# Patient Record
Sex: Male | Born: 1950 | Hispanic: No | Marital: Married
Health system: Southern US, Community
[De-identification: ages and names within clinical notes are randomized; demographics above are authoritative.]

---

## 2003-04-16 ENCOUNTER — Other Ambulatory Visit: Payer: Self-pay

## 2003-08-16 ENCOUNTER — Other Ambulatory Visit: Payer: Self-pay

## 2004-01-08 ENCOUNTER — Other Ambulatory Visit: Payer: Self-pay

## 2004-08-21 ENCOUNTER — Emergency Department: Payer: Self-pay | Admitting: Unknown Physician Specialty

## 2004-08-21 ENCOUNTER — Other Ambulatory Visit: Payer: Self-pay

## 2004-09-01 ENCOUNTER — Ambulatory Visit: Payer: Self-pay | Admitting: Internal Medicine

## 2004-10-23 ENCOUNTER — Emergency Department: Payer: Self-pay | Admitting: Emergency Medicine

## 2005-01-19 ENCOUNTER — Other Ambulatory Visit: Payer: Self-pay

## 2005-01-20 ENCOUNTER — Other Ambulatory Visit: Payer: Self-pay

## 2005-01-20 ENCOUNTER — Inpatient Hospital Stay: Payer: Self-pay | Admitting: Internal Medicine

## 2005-01-21 ENCOUNTER — Other Ambulatory Visit: Payer: Self-pay

## 2005-06-03 ENCOUNTER — Inpatient Hospital Stay: Payer: Self-pay | Admitting: Internal Medicine

## 2005-06-03 ENCOUNTER — Other Ambulatory Visit: Payer: Self-pay

## 2005-06-04 ENCOUNTER — Other Ambulatory Visit: Payer: Self-pay

## 2005-08-22 ENCOUNTER — Other Ambulatory Visit: Payer: Self-pay

## 2005-08-22 ENCOUNTER — Inpatient Hospital Stay: Payer: Self-pay | Admitting: Internal Medicine

## 2005-08-23 ENCOUNTER — Other Ambulatory Visit: Payer: Self-pay

## 2005-09-18 ENCOUNTER — Inpatient Hospital Stay: Payer: Self-pay | Admitting: Internal Medicine

## 2005-09-18 ENCOUNTER — Other Ambulatory Visit: Payer: Self-pay

## 2005-09-28 ENCOUNTER — Encounter: Payer: Self-pay | Admitting: Internal Medicine

## 2005-10-09 ENCOUNTER — Encounter: Payer: Self-pay | Admitting: Internal Medicine

## 2005-11-09 ENCOUNTER — Encounter: Payer: Self-pay | Admitting: Internal Medicine

## 2005-12-07 ENCOUNTER — Encounter: Payer: Self-pay | Admitting: Adult Health Nurse Practitioner

## 2005-12-10 ENCOUNTER — Encounter: Payer: Self-pay | Admitting: Internal Medicine

## 2005-12-10 ENCOUNTER — Encounter: Payer: Self-pay | Admitting: Adult Health Nurse Practitioner

## 2005-12-22 ENCOUNTER — Ambulatory Visit: Payer: Self-pay | Admitting: Infectious Diseases

## 2006-01-02 ENCOUNTER — Ambulatory Visit: Payer: Self-pay | Admitting: Vascular Surgery

## 2006-01-06 ENCOUNTER — Ambulatory Visit: Payer: Self-pay | Admitting: Vascular Surgery

## 2006-01-09 ENCOUNTER — Encounter: Payer: Self-pay | Admitting: Internal Medicine

## 2006-01-15 ENCOUNTER — Other Ambulatory Visit: Payer: Self-pay

## 2006-01-15 ENCOUNTER — Inpatient Hospital Stay: Payer: Self-pay | Admitting: Internal Medicine

## 2006-01-16 ENCOUNTER — Other Ambulatory Visit: Payer: Self-pay

## 2006-01-17 ENCOUNTER — Other Ambulatory Visit: Payer: Self-pay

## 2006-02-09 ENCOUNTER — Encounter: Payer: Self-pay | Admitting: Internal Medicine

## 2006-03-11 ENCOUNTER — Encounter: Payer: Self-pay | Admitting: Internal Medicine

## 2006-05-18 ENCOUNTER — Ambulatory Visit: Payer: Self-pay | Admitting: Orthopaedic Surgery

## 2006-05-23 ENCOUNTER — Ambulatory Visit: Payer: Self-pay | Admitting: Orthopaedic Surgery

## 2006-06-26 ENCOUNTER — Emergency Department: Payer: Self-pay | Admitting: Unknown Physician Specialty

## 2006-10-14 ENCOUNTER — Inpatient Hospital Stay: Payer: Self-pay | Admitting: Internal Medicine

## 2006-10-14 ENCOUNTER — Other Ambulatory Visit: Payer: Self-pay

## 2006-10-15 ENCOUNTER — Other Ambulatory Visit: Payer: Self-pay

## 2007-03-17 ENCOUNTER — Other Ambulatory Visit: Payer: Self-pay

## 2007-03-17 ENCOUNTER — Observation Stay: Payer: Self-pay | Admitting: Internal Medicine

## 2007-03-18 ENCOUNTER — Other Ambulatory Visit: Payer: Self-pay

## 2007-09-12 ENCOUNTER — Emergency Department: Payer: Self-pay | Admitting: Emergency Medicine

## 2007-09-27 ENCOUNTER — Ambulatory Visit: Payer: Self-pay | Admitting: Pain Medicine

## 2007-10-08 ENCOUNTER — Ambulatory Visit: Payer: Self-pay | Admitting: Pain Medicine

## 2007-10-09 ENCOUNTER — Ambulatory Visit: Payer: Self-pay | Admitting: Pain Medicine

## 2007-10-22 ENCOUNTER — Emergency Department: Payer: Self-pay | Admitting: Emergency Medicine

## 2007-11-01 ENCOUNTER — Ambulatory Visit: Payer: Self-pay | Admitting: Physician Assistant

## 2007-11-06 ENCOUNTER — Ambulatory Visit: Payer: Self-pay | Admitting: Pain Medicine

## 2007-11-27 ENCOUNTER — Ambulatory Visit: Payer: Self-pay | Admitting: Pain Medicine

## 2007-12-12 ENCOUNTER — Ambulatory Visit: Payer: Self-pay | Admitting: Physician Assistant

## 2008-03-17 ENCOUNTER — Emergency Department: Payer: Self-pay | Admitting: Emergency Medicine

## 2008-03-31 ENCOUNTER — Ambulatory Visit: Payer: Self-pay | Admitting: Otolaryngology

## 2008-03-31 ENCOUNTER — Ambulatory Visit: Payer: Self-pay | Admitting: Internal Medicine

## 2008-04-10 ENCOUNTER — Ambulatory Visit: Payer: Self-pay | Admitting: Otolaryngology

## 2008-09-19 ENCOUNTER — Ambulatory Visit: Payer: Self-pay | Admitting: Internal Medicine

## 2008-09-20 ENCOUNTER — Emergency Department: Payer: Self-pay | Admitting: Emergency Medicine

## 2009-08-23 ENCOUNTER — Inpatient Hospital Stay: Payer: Self-pay | Admitting: Internal Medicine

## 2009-10-30 ENCOUNTER — Observation Stay: Payer: Self-pay | Admitting: Internal Medicine

## 2009-11-06 ENCOUNTER — Ambulatory Visit: Payer: Self-pay | Admitting: Internal Medicine

## 2010-01-13 ENCOUNTER — Encounter: Payer: Self-pay | Admitting: Otolaryngology

## 2010-02-09 ENCOUNTER — Encounter: Payer: Self-pay | Admitting: Otolaryngology

## 2010-03-11 ENCOUNTER — Ambulatory Visit: Payer: Self-pay | Admitting: Internal Medicine

## 2010-09-24 ENCOUNTER — Emergency Department: Payer: Self-pay | Admitting: Emergency Medicine

## 2011-02-01 ENCOUNTER — Emergency Department: Payer: Self-pay | Admitting: Emergency Medicine

## 2011-02-02 ENCOUNTER — Emergency Department: Payer: Self-pay | Admitting: Internal Medicine

## 2011-06-20 ENCOUNTER — Emergency Department: Payer: Self-pay | Admitting: Emergency Medicine

## 2011-06-20 LAB — COMPREHENSIVE METABOLIC PANEL
Alkaline Phosphatase: 32 U/L — ABNORMAL LOW (ref 50–136)
Anion Gap: 13 (ref 7–16)
Bilirubin,Total: 0.5 mg/dL (ref 0.2–1.0)
Calcium, Total: 8.5 mg/dL (ref 8.5–10.1)
Chloride: 106 mmol/L (ref 98–107)
Co2: 25 mmol/L (ref 21–32)
Creatinine: 2.34 mg/dL — ABNORMAL HIGH (ref 0.60–1.30)
EGFR (African American): 37 — ABNORMAL LOW
EGFR (Non-African Amer.): 30 — ABNORMAL LOW
Osmolality: 293 (ref 275–301)
Potassium: 4 mmol/L (ref 3.5–5.1)
Sodium: 144 mmol/L (ref 136–145)

## 2011-06-20 LAB — CK TOTAL AND CKMB (NOT AT ARMC): CK, Total: 194 U/L (ref 35–232)

## 2011-06-20 LAB — CBC
HCT: 41.5 % (ref 40.0–52.0)
Platelet: 214 10*3/uL (ref 150–440)
RBC: 4.7 10*6/uL (ref 4.40–5.90)
RDW: 15.3 % — ABNORMAL HIGH (ref 11.5–14.5)
WBC: 5.9 10*3/uL (ref 3.8–10.6)

## 2011-06-20 LAB — TROPONIN I: Troponin-I: <0.02 ng/mL

## 2011-06-20 LAB — PROTIME-INR: Prothrombin Time: 12.3 secs (ref 11.5–14.7)

## 2011-06-20 LAB — APTT: Activated PTT: 33.7 secs (ref 23.6–35.9)

## 2012-01-29 ENCOUNTER — Inpatient Hospital Stay: Payer: Self-pay | Admitting: Internal Medicine

## 2012-01-29 LAB — COMPREHENSIVE METABOLIC PANEL
Albumin: 3.6 g/dL (ref 3.4–5.0)
Alkaline Phosphatase: 61 U/L (ref 50–136)
Calcium, Total: 8.3 mg/dL — ABNORMAL LOW (ref 8.5–10.1)
EGFR (African American): 41 — ABNORMAL LOW
Glucose: 187 mg/dL — ABNORMAL HIGH (ref 65–99)
Osmolality: 294 (ref 275–301)
Potassium: 4.3 mmol/L (ref 3.5–5.1)
SGOT(AST): 26 U/L (ref 15–37)
Sodium: 142 mmol/L (ref 136–145)

## 2012-01-29 LAB — CK TOTAL AND CKMB (NOT AT ARMC)
CK, Total: 213 U/L (ref 35–232)
CK-MB: 2.6 ng/mL (ref 0.5–3.6)

## 2012-01-29 LAB — CBC
HCT: 38.4 % — ABNORMAL LOW (ref 40.0–52.0)
MCV: 90 fL (ref 80–100)
Platelet: 234 10*3/uL (ref 150–440)
RBC: 4.29 10*6/uL — ABNORMAL LOW (ref 4.40–5.90)
WBC: 7.1 10*3/uL (ref 3.8–10.6)

## 2012-01-29 LAB — LIPASE, BLOOD: Lipase: 167 U/L (ref 73–393)

## 2012-01-29 LAB — PROTIME-INR
INR: 0.9
Prothrombin Time: 12.4 secs (ref 11.5–14.7)

## 2012-01-29 LAB — TROPONIN I: Troponin-I: 0.02 ng/mL

## 2012-01-30 LAB — TROPONIN I: Troponin-I: <0.02 ng/mL

## 2012-01-30 LAB — BASIC METABOLIC PANEL
Anion Gap: 9 (ref 7–16)
BUN: 27 mg/dL — ABNORMAL HIGH (ref 7–18)
Co2: 27 mmol/L (ref 21–32)
Creatinine: 2.01 mg/dL — ABNORMAL HIGH (ref 0.60–1.30)
EGFR (African American): 40 — ABNORMAL LOW
EGFR (Non-African Amer.): 34 — ABNORMAL LOW
Glucose: 154 mg/dL — ABNORMAL HIGH (ref 65–99)
Osmolality: 289 (ref 275–301)
Potassium: 4.5 mmol/L (ref 3.5–5.1)
Sodium: 141 mmol/L (ref 136–145)

## 2012-01-30 LAB — LIPID PANEL
Cholesterol: 204 mg/dL — ABNORMAL HIGH (ref 0–200)
HDL Cholesterol: 30 mg/dL — ABNORMAL LOW (ref 40–60)
VLDL Cholesterol, Calc: 71 mg/dL — ABNORMAL HIGH (ref 5–40)

## 2012-01-30 LAB — CK TOTAL AND CKMB (NOT AT ARMC)
CK, Total: 193 U/L (ref 35–232)
CK-MB: 2.8 ng/mL (ref 0.5–3.6)
CK-MB: 2.8 ng/mL (ref 0.5–3.6)

## 2012-01-30 LAB — HEMOGLOBIN A1C: Hemoglobin A1C: 7.7 % — ABNORMAL HIGH (ref 4.2–6.3)

## 2012-01-30 LAB — MAGNESIUM: Magnesium: 1.3 mg/dL — ABNORMAL LOW

## 2012-01-30 LAB — APTT: Activated PTT: 115.2 secs — ABNORMAL HIGH (ref 23.6–35.9)

## 2012-01-31 LAB — APTT
Activated PTT: 151.3 secs — ABNORMAL HIGH (ref 23.6–35.9)
Activated PTT: 89.8 secs — ABNORMAL HIGH (ref 23.6–35.9)

## 2012-01-31 LAB — MAGNESIUM: Magnesium: 2.1 mg/dL

## 2013-01-20 ENCOUNTER — Observation Stay: Payer: Self-pay | Admitting: Specialist

## 2013-01-20 LAB — MAGNESIUM: Magnesium: 0.6 mg/dL — ABNORMAL LOW

## 2013-01-20 LAB — BASIC METABOLIC PANEL
Anion Gap: 5 — ABNORMAL LOW (ref 7–16)
BUN: 35 mg/dL — ABNORMAL HIGH (ref 7–18)
Calcium, Total: 8.2 mg/dL — ABNORMAL LOW (ref 8.5–10.1)
Chloride: 104 mmol/L (ref 98–107)
Creatinine: 2.66 mg/dL — ABNORMAL HIGH (ref 0.60–1.30)
EGFR (African American): 28 — ABNORMAL LOW
EGFR (Non-African Amer.): 24 — ABNORMAL LOW
Glucose: 136 mg/dL — ABNORMAL HIGH (ref 65–99)
Osmolality: 288 (ref 275–301)
Sodium: 139 mmol/L (ref 136–145)

## 2013-01-20 LAB — CBC
HCT: 38.7 % — ABNORMAL LOW (ref 40.0–52.0)
HGB: 12.9 g/dL — ABNORMAL LOW (ref 13.0–18.0)
MCHC: 33.2 g/dL (ref 32.0–36.0)
MCV: 88 fL (ref 80–100)
RBC: 4.39 10*6/uL — ABNORMAL LOW (ref 4.40–5.90)
RDW: 14.7 % — ABNORMAL HIGH (ref 11.5–14.5)
WBC: 5.8 10*3/uL (ref 3.8–10.6)

## 2013-01-20 LAB — PROTIME-INR
INR: 1.7
Prothrombin Time: 19.7 secs — ABNORMAL HIGH (ref 11.5–14.7)

## 2013-01-20 LAB — CK-MB
CK-MB: 3.1 ng/mL (ref 0.5–3.6)
CK-MB: 3.3 ng/mL (ref 0.5–3.6)

## 2013-01-20 LAB — APTT: Activated PTT: 40.7 secs — ABNORMAL HIGH (ref 23.6–35.9)

## 2013-01-20 LAB — TROPONIN I: Troponin-I: <0.02 ng/mL

## 2013-01-21 LAB — CK-MB: CK-MB: 3.2 ng/mL (ref 0.5–3.6)

## 2013-01-22 LAB — BASIC METABOLIC PANEL
Anion Gap: 9 (ref 7–16)
BUN: 39 mg/dL — ABNORMAL HIGH (ref 7–18)
Calcium, Total: 8.3 mg/dL — ABNORMAL LOW (ref 8.5–10.1)
Chloride: 103 mmol/L (ref 98–107)
Creatinine: 2.3 mg/dL — ABNORMAL HIGH (ref 0.60–1.30)
Sodium: 137 mmol/L (ref 136–145)

## 2013-07-10 ENCOUNTER — Emergency Department: Payer: Self-pay | Admitting: Emergency Medicine

## 2013-07-10 LAB — CBC
HCT: 37.1 % — AB (ref 40.0–52.0)
HGB: 12.1 g/dL — ABNORMAL LOW (ref 13.0–18.0)
MCH: 28.7 pg (ref 26.0–34.0)
MCHC: 32.5 g/dL (ref 32.0–36.0)
MCV: 88 fL (ref 80–100)
PLATELETS: 198 10*3/uL (ref 150–440)
RBC: 4.21 10*6/uL — AB (ref 4.40–5.90)
RDW: 15.4 % — AB (ref 11.5–14.5)
WBC: 5.6 10*3/uL (ref 3.8–10.6)

## 2013-07-10 LAB — BASIC METABOLIC PANEL
ANION GAP: 8 (ref 7–16)
BUN: 25 mg/dL — ABNORMAL HIGH (ref 7–18)
CO2: 25 mmol/L (ref 21–32)
Calcium, Total: 8.4 mg/dL — ABNORMAL LOW (ref 8.5–10.1)
Chloride: 106 mmol/L (ref 98–107)
Creatinine: 2.11 mg/dL — ABNORMAL HIGH (ref 0.60–1.30)
EGFR (African American): 37 — ABNORMAL LOW
GFR CALC NON AF AMER: 32 — AB
Glucose: 156 mg/dL — ABNORMAL HIGH (ref 65–99)
Osmolality: 285 (ref 275–301)
Potassium: 3.7 mmol/L (ref 3.5–5.1)
Sodium: 139 mmol/L (ref 136–145)

## 2013-07-10 LAB — TROPONIN I
TROPONIN-I: 0.03 ng/mL
Troponin-I: 0.03 ng/mL

## 2013-07-10 LAB — PRO B NATRIURETIC PEPTIDE: B-Type Natriuretic Peptide: 680 pg/mL — ABNORMAL HIGH (ref 0–125)

## 2013-08-21 ENCOUNTER — Ambulatory Visit: Payer: Self-pay | Admitting: Vascular Surgery

## 2013-08-22 LAB — PATHOLOGY REPORT

## 2013-12-04 ENCOUNTER — Inpatient Hospital Stay: Payer: Self-pay | Admitting: Internal Medicine

## 2013-12-04 LAB — TROPONIN I
TROPONIN-I: 0.07 ng/mL — AB
Troponin-I: 0.13 ng/mL — ABNORMAL HIGH

## 2013-12-04 LAB — CK TOTAL AND CKMB (NOT AT ARMC)
CK, TOTAL: 199 U/L
CK, TOTAL: 235 U/L
CK, Total: 269 U/L
CK-MB: 2.9 ng/mL (ref 0.5–3.6)
CK-MB: 2.6 ng/mL (ref 0.5–3.6)
CK-MB: 3.2 ng/mL (ref 0.5–3.6)

## 2013-12-04 LAB — COMPREHENSIVE METABOLIC PANEL
Albumin: 3.3 g/dL — ABNORMAL LOW (ref 3.4–5.0)
Alkaline Phosphatase: 65 U/L
Anion Gap: 10 (ref 7–16)
BUN: 49 mg/dL — ABNORMAL HIGH (ref 7–18)
Bilirubin,Total: 0.5 mg/dL (ref 0.2–1.0)
CHLORIDE: 107 mmol/L (ref 98–107)
CREATININE: 2.55 mg/dL — AB (ref 0.60–1.30)
Calcium, Total: 8.8 mg/dL (ref 8.5–10.1)
Co2: 24 mmol/L (ref 21–32)
EGFR (African American): 29 — ABNORMAL LOW
GFR CALC NON AF AMER: 25 — AB
GLUCOSE: 198 mg/dL — AB (ref 65–99)
OSMOLALITY: 300 (ref 275–301)
Potassium: 4.1 mmol/L (ref 3.5–5.1)
SGOT(AST): 24 U/L (ref 15–37)
SGPT (ALT): 18 U/L
SODIUM: 141 mmol/L (ref 136–145)
Total Protein: 6.9 g/dL (ref 6.4–8.2)

## 2013-12-04 LAB — CBC
HCT: 36.9 % — ABNORMAL LOW (ref 40.0–52.0)
HGB: 11.9 g/dL — ABNORMAL LOW (ref 13.0–18.0)
MCH: 29.5 pg (ref 26.0–34.0)
MCHC: 32.4 g/dL (ref 32.0–36.0)
MCV: 91 fL (ref 80–100)
Platelet: 237 10*3/uL (ref 150–440)
RBC: 4.05 10*6/uL — AB (ref 4.40–5.90)
RDW: 15.3 % — ABNORMAL HIGH (ref 11.5–14.5)
WBC: 4.5 10*3/uL (ref 3.8–10.6)

## 2013-12-04 LAB — PROTIME-INR
INR: 1.7
Prothrombin Time: 19.8 secs — ABNORMAL HIGH (ref 11.5–14.7)

## 2013-12-04 LAB — APTT: Activated PTT: 134 secs — ABNORMAL HIGH (ref 23.6–35.9)

## 2013-12-05 LAB — CBC WITH DIFFERENTIAL/PLATELET
BASOS ABS: 0 10*3/uL (ref 0.0–0.1)
BASOS PCT: 0.3 %
EOS ABS: 0.3 10*3/uL (ref 0.0–0.7)
Eosinophil %: 5.4 %
HCT: 35.4 % — AB (ref 40.0–52.0)
HGB: 11.4 g/dL — ABNORMAL LOW (ref 13.0–18.0)
LYMPHS PCT: 38.7 %
Lymphocyte #: 1.9 10*3/uL (ref 1.0–3.6)
MCH: 29.2 pg (ref 26.0–34.0)
MCHC: 32.1 g/dL (ref 32.0–36.0)
MCV: 91 fL (ref 80–100)
MONO ABS: 0.7 x10 3/mm (ref 0.2–1.0)
Monocyte %: 14.4 %
NEUTROS ABS: 2 10*3/uL (ref 1.4–6.5)
Neutrophil %: 41.2 %
Platelet: 218 10*3/uL (ref 150–440)
RBC: 3.9 10*6/uL — AB (ref 4.40–5.90)
RDW: 14.9 % — ABNORMAL HIGH (ref 11.5–14.5)
WBC: 4.9 10*3/uL (ref 3.8–10.6)

## 2013-12-05 LAB — LIPID PANEL
CHOLESTEROL: 174 mg/dL (ref 0–200)
HDL Cholesterol: 28 mg/dL — ABNORMAL LOW (ref 40–60)
LDL CHOLESTEROL, CALC: 103 mg/dL — AB (ref 0–100)
Triglycerides: 214 mg/dL — ABNORMAL HIGH (ref 0–200)
VLDL Cholesterol, Calc: 43 mg/dL — ABNORMAL HIGH (ref 5–40)

## 2013-12-05 LAB — BASIC METABOLIC PANEL
Anion Gap: 11 (ref 7–16)
BUN: 52 mg/dL — ABNORMAL HIGH (ref 7–18)
CO2: 25 mmol/L (ref 21–32)
Calcium, Total: 9.1 mg/dL (ref 8.5–10.1)
Chloride: 106 mmol/L (ref 98–107)
Creatinine: 2.76 mg/dL — ABNORMAL HIGH (ref 0.60–1.30)
EGFR (Non-African Amer.): 23 — ABNORMAL LOW
GFR CALC AF AMER: 27 — AB
GLUCOSE: 171 mg/dL — AB (ref 65–99)
OSMOLALITY: 301 (ref 275–301)
Potassium: 4 mmol/L (ref 3.5–5.1)
Sodium: 142 mmol/L (ref 136–145)

## 2013-12-05 LAB — HEPARIN LEVEL (UNFRACTIONATED)
Anti-Xa(Unfractionated): 1.06 IU/mL — ABNORMAL HIGH (ref 0.30–0.70)
Anti-Xa(Unfractionated): 0.46 IU/mL (ref 0.30–0.70)

## 2013-12-06 LAB — BASIC METABOLIC PANEL
Anion Gap: 9 (ref 7–16)
BUN: 55 mg/dL — ABNORMAL HIGH (ref 7–18)
CALCIUM: 9.4 mg/dL (ref 8.5–10.1)
CREATININE: 2.78 mg/dL — AB (ref 0.60–1.30)
Chloride: 101 mmol/L (ref 98–107)
Co2: 25 mmol/L (ref 21–32)
EGFR (African American): 26 — ABNORMAL LOW
EGFR (Non-African Amer.): 23 — ABNORMAL LOW
GLUCOSE: 140 mg/dL — AB (ref 65–99)
Osmolality: 288 (ref 275–301)
POTASSIUM: 4 mmol/L (ref 3.5–5.1)
SODIUM: 135 mmol/L — AB (ref 136–145)

## 2013-12-06 LAB — CBC WITH DIFFERENTIAL/PLATELET
Basophil #: 0.1 10*3/uL (ref 0.0–0.1)
Basophil %: 1 %
Eosinophil #: 0.3 10*3/uL (ref 0.0–0.7)
Eosinophil %: 4.3 %
HCT: 37.4 % — ABNORMAL LOW (ref 40.0–52.0)
HGB: 12.4 g/dL — ABNORMAL LOW (ref 13.0–18.0)
LYMPHS ABS: 2.6 10*3/uL (ref 1.0–3.6)
LYMPHS PCT: 39.9 %
MCH: 29.9 pg (ref 26.0–34.0)
MCHC: 33.1 g/dL (ref 32.0–36.0)
MCV: 90 fL (ref 80–100)
Monocyte #: 1 x10 3/mm (ref 0.2–1.0)
Monocyte %: 15.4 %
NEUTROS ABS: 2.5 10*3/uL (ref 1.4–6.5)
Neutrophil %: 39.4 %
PLATELETS: 247 10*3/uL (ref 150–440)
RBC: 4.15 10*6/uL — ABNORMAL LOW (ref 4.40–5.90)
RDW: 15.2 % — ABNORMAL HIGH (ref 11.5–14.5)
WBC: 6.5 10*3/uL (ref 3.8–10.6)

## 2013-12-14 ENCOUNTER — Inpatient Hospital Stay: Payer: Self-pay | Admitting: Internal Medicine

## 2013-12-14 LAB — BASIC METABOLIC PANEL
ANION GAP: 11 (ref 7–16)
BUN: 46 mg/dL — AB (ref 7–18)
Calcium, Total: 9.5 mg/dL (ref 8.5–10.1)
Chloride: 103 mmol/L (ref 98–107)
Co2: 23 mmol/L (ref 21–32)
Creatinine: 2.79 mg/dL — ABNORMAL HIGH (ref 0.60–1.30)
EGFR (African American): 26 — ABNORMAL LOW
EGFR (Non-African Amer.): 23 — ABNORMAL LOW
GLUCOSE: 192 mg/dL — AB (ref 65–99)
OSMOLALITY: 291 (ref 275–301)
POTASSIUM: 4.4 mmol/L (ref 3.5–5.1)
SODIUM: 137 mmol/L (ref 136–145)

## 2013-12-14 LAB — APTT: Activated PTT: 32.3 secs (ref 23.6–35.9)

## 2013-12-14 LAB — CBC
HCT: 37.8 % — AB (ref 40.0–52.0)
HGB: 12.7 g/dL — ABNORMAL LOW (ref 13.0–18.0)
MCH: 30.5 pg (ref 26.0–34.0)
MCHC: 33.7 g/dL (ref 32.0–36.0)
MCV: 91 fL (ref 80–100)
Platelet: 242 10*3/uL (ref 150–440)
RBC: 4.17 10*6/uL — ABNORMAL LOW (ref 4.40–5.90)
RDW: 15.4 % — ABNORMAL HIGH (ref 11.5–14.5)
WBC: 7.6 10*3/uL (ref 3.8–10.6)

## 2013-12-14 LAB — PROTIME-INR
INR: 1.4
Prothrombin Time: 17.1 secs — ABNORMAL HIGH (ref 11.5–14.7)

## 2013-12-14 LAB — CK TOTAL AND CKMB (NOT AT ARMC)
CK, Total: 351 U/L — ABNORMAL HIGH
CK-MB: 2.8 ng/mL (ref 0.5–3.6)

## 2013-12-14 LAB — TROPONIN I: TROPONIN-I: 0.1 ng/mL — AB

## 2013-12-15 LAB — HEPARIN LEVEL (UNFRACTIONATED): Anti-Xa(Unfractionated): 0.41 IU/mL (ref 0.30–0.70)

## 2013-12-15 LAB — CK-MB
CK-MB: 5 ng/mL — ABNORMAL HIGH (ref 0.5–3.6)
CK-MB: 4.3 ng/mL — AB (ref 0.5–3.6)
CK-MB: 4.8 ng/mL — AB (ref 0.5–3.6)

## 2013-12-15 LAB — CBC WITH DIFFERENTIAL/PLATELET
BASOS ABS: 0.1 10*3/uL (ref 0.0–0.1)
BASOS PCT: 1.2 %
EOS ABS: 0.4 10*3/uL (ref 0.0–0.7)
EOS PCT: 6.4 %
HCT: 35.9 % — AB (ref 40.0–52.0)
HGB: 12.3 g/dL — AB (ref 13.0–18.0)
LYMPHS PCT: 36.5 %
Lymphocyte #: 2.1 10*3/uL (ref 1.0–3.6)
MCH: 30.6 pg (ref 26.0–34.0)
MCHC: 34.3 g/dL (ref 32.0–36.0)
MCV: 89 fL (ref 80–100)
MONOS PCT: 10.4 %
Monocyte #: 0.6 x10 3/mm (ref 0.2–1.0)
Neutrophil #: 2.7 10*3/uL (ref 1.4–6.5)
Neutrophil %: 45.5 %
Platelet: 240 10*3/uL (ref 150–440)
RBC: 4.02 10*6/uL — ABNORMAL LOW (ref 4.40–5.90)
RDW: 14.8 % — ABNORMAL HIGH (ref 11.5–14.5)
WBC: 5.9 10*3/uL (ref 3.8–10.6)

## 2013-12-15 LAB — BASIC METABOLIC PANEL
Anion Gap: 10 (ref 7–16)
BUN: 46 mg/dL — AB (ref 7–18)
CHLORIDE: 105 mmol/L (ref 98–107)
CREATININE: 2.84 mg/dL — AB (ref 0.60–1.30)
Calcium, Total: 9.1 mg/dL (ref 8.5–10.1)
Co2: 26 mmol/L (ref 21–32)
EGFR (African American): 26 — ABNORMAL LOW
EGFR (Non-African Amer.): 22 — ABNORMAL LOW
Glucose: 140 mg/dL — ABNORMAL HIGH (ref 65–99)
Osmolality: 295 (ref 275–301)
POTASSIUM: 4.3 mmol/L (ref 3.5–5.1)
Sodium: 141 mmol/L (ref 136–145)

## 2013-12-15 LAB — TROPONIN I
Troponin-I: 0.56 ng/mL — ABNORMAL HIGH
Troponin-I: 0.67 ng/mL — ABNORMAL HIGH
Troponin-I: 0.71 ng/mL — ABNORMAL HIGH

## 2013-12-16 LAB — CBC WITH DIFFERENTIAL/PLATELET
Basophil #: 0.1 10*3/uL (ref 0.0–0.1)
Basophil %: 1.2 %
EOS PCT: 7.2 %
Eosinophil #: 0.4 10*3/uL (ref 0.0–0.7)
HCT: 36.4 % — ABNORMAL LOW (ref 40.0–52.0)
HGB: 12.1 g/dL — AB (ref 13.0–18.0)
LYMPHS PCT: 40.9 %
Lymphocyte #: 2.4 10*3/uL (ref 1.0–3.6)
MCH: 29.5 pg (ref 26.0–34.0)
MCHC: 33.3 g/dL (ref 32.0–36.0)
MCV: 89 fL (ref 80–100)
Monocyte #: 0.6 x10 3/mm (ref 0.2–1.0)
Monocyte %: 10.5 %
NEUTROS ABS: 2.4 10*3/uL (ref 1.4–6.5)
Neutrophil %: 40.2 %
PLATELETS: 243 10*3/uL (ref 150–440)
RBC: 4.11 10*6/uL — ABNORMAL LOW (ref 4.40–5.90)
RDW: 15 % — ABNORMAL HIGH (ref 11.5–14.5)
WBC: 6 10*3/uL (ref 3.8–10.6)

## 2013-12-16 LAB — HEPARIN LEVEL (UNFRACTIONATED)
ANTI-XA(UNFRACTIONATED): 0.36 [IU]/mL (ref 0.30–0.70)
Anti-Xa(Unfractionated): 0.27 IU/mL — ABNORMAL LOW (ref 0.30–0.70)

## 2013-12-17 LAB — CBC WITH DIFFERENTIAL/PLATELET
BASOS ABS: 0.1 10*3/uL (ref 0.0–0.1)
Basophil %: 1 %
Eosinophil #: 0.4 10*3/uL (ref 0.0–0.7)
Eosinophil %: 6.4 %
HCT: 35.5 % — ABNORMAL LOW (ref 40.0–52.0)
HGB: 11.5 g/dL — AB (ref 13.0–18.0)
LYMPHS ABS: 2.9 10*3/uL (ref 1.0–3.6)
Lymphocyte %: 46.6 %
MCH: 29.1 pg (ref 26.0–34.0)
MCHC: 32.4 g/dL (ref 32.0–36.0)
MCV: 90 fL (ref 80–100)
MONO ABS: 0.6 x10 3/mm (ref 0.2–1.0)
Monocyte %: 10.3 %
NEUTROS ABS: 2.2 10*3/uL (ref 1.4–6.5)
NEUTROS PCT: 35.7 %
Platelet: 226 10*3/uL (ref 150–440)
RBC: 3.94 10*6/uL — AB (ref 4.40–5.90)
RDW: 15.2 % — ABNORMAL HIGH (ref 11.5–14.5)
WBC: 6.2 10*3/uL (ref 3.8–10.6)

## 2013-12-17 LAB — BASIC METABOLIC PANEL
ANION GAP: 10 (ref 7–16)
BUN: 40 mg/dL — ABNORMAL HIGH (ref 7–18)
CREATININE: 2.38 mg/dL — AB (ref 0.60–1.30)
Calcium, Total: 9.1 mg/dL (ref 8.5–10.1)
Chloride: 102 mmol/L (ref 98–107)
Co2: 27 mmol/L (ref 21–32)
EGFR (African American): 32 — ABNORMAL LOW
GFR CALC NON AF AMER: 28 — AB
Glucose: 154 mg/dL — ABNORMAL HIGH (ref 65–99)
OSMOLALITY: 290 (ref 275–301)
Potassium: 3.8 mmol/L (ref 3.5–5.1)
Sodium: 139 mmol/L (ref 136–145)

## 2013-12-17 LAB — HEPARIN LEVEL (UNFRACTIONATED): Anti-Xa(Unfractionated): 0.42 IU/mL (ref 0.30–0.70)

## 2013-12-18 LAB — BASIC METABOLIC PANEL
Anion Gap: 6 — ABNORMAL LOW (ref 7–16)
BUN: 36 mg/dL — ABNORMAL HIGH (ref 7–18)
CHLORIDE: 100 mmol/L (ref 98–107)
CO2: 32 mmol/L (ref 21–32)
CREATININE: 2.28 mg/dL — AB (ref 0.60–1.30)
Calcium, Total: 9 mg/dL (ref 8.5–10.1)
GFR CALC AF AMER: 34 — AB
GFR CALC NON AF AMER: 29 — AB
Glucose: 114 mg/dL — ABNORMAL HIGH (ref 65–99)
Osmolality: 285 (ref 275–301)
Potassium: 3.7 mmol/L (ref 3.5–5.1)
SODIUM: 138 mmol/L (ref 136–145)

## 2013-12-18 LAB — CBC WITH DIFFERENTIAL/PLATELET
Basophil #: 0.1 10*3/uL (ref 0.0–0.1)
Basophil %: 1.2 %
Eosinophil #: 0.3 10*3/uL (ref 0.0–0.7)
Eosinophil %: 5.9 %
HCT: 34.4 % — ABNORMAL LOW (ref 40.0–52.0)
HGB: 11.4 g/dL — AB (ref 13.0–18.0)
LYMPHS ABS: 1.9 10*3/uL (ref 1.0–3.6)
LYMPHS PCT: 33.8 %
MCH: 29.5 pg (ref 26.0–34.0)
MCHC: 33.2 g/dL (ref 32.0–36.0)
MCV: 89 fL (ref 80–100)
MONOS PCT: 10.3 %
Monocyte #: 0.6 x10 3/mm (ref 0.2–1.0)
NEUTROS ABS: 2.8 10*3/uL (ref 1.4–6.5)
Neutrophil %: 48.8 %
Platelet: 219 10*3/uL (ref 150–440)
RBC: 3.86 10*6/uL — AB (ref 4.40–5.90)
RDW: 15.2 % — ABNORMAL HIGH (ref 11.5–14.5)
WBC: 5.7 10*3/uL (ref 3.8–10.6)

## 2013-12-18 LAB — HEPARIN LEVEL (UNFRACTIONATED)

## 2014-01-09 ENCOUNTER — Ambulatory Visit: Payer: Self-pay | Admitting: Internal Medicine

## 2014-01-21 ENCOUNTER — Emergency Department: Payer: Self-pay | Admitting: Emergency Medicine

## 2014-01-21 LAB — COMPREHENSIVE METABOLIC PANEL
Albumin: 3.1 g/dL — ABNORMAL LOW (ref 3.4–5.0)
Alkaline Phosphatase: 58 U/L
Anion Gap: 9 (ref 7–16)
BUN: 58 mg/dL — AB (ref 7–18)
Bilirubin,Total: 0.3 mg/dL (ref 0.2–1.0)
CALCIUM: 8.9 mg/dL (ref 8.5–10.1)
CHLORIDE: 102 mmol/L (ref 98–107)
CO2: 27 mmol/L (ref 21–32)
Creatinine: 2.99 mg/dL — ABNORMAL HIGH (ref 0.60–1.30)
EGFR (African American): 27 — ABNORMAL LOW
EGFR (Non-African Amer.): 23 — ABNORMAL LOW
GLUCOSE: 130 mg/dL — AB (ref 65–99)
Osmolality: 294 (ref 275–301)
Potassium: 4 mmol/L (ref 3.5–5.1)
SGOT(AST): 16 U/L (ref 15–37)
SGPT (ALT): 10 U/L — ABNORMAL LOW
SODIUM: 138 mmol/L (ref 136–145)
Total Protein: 7.6 g/dL (ref 6.4–8.2)

## 2014-01-21 LAB — CBC
HCT: 33.7 % — ABNORMAL LOW (ref 40.0–52.0)
HGB: 10.4 g/dL — AB (ref 13.0–18.0)
MCH: 28 pg (ref 26.0–34.0)
MCHC: 30.8 g/dL — ABNORMAL LOW (ref 32.0–36.0)
MCV: 91 fL (ref 80–100)
PLATELETS: 282 10*3/uL (ref 150–440)
RBC: 3.71 10*6/uL — AB (ref 4.40–5.90)
RDW: 15.3 % — ABNORMAL HIGH (ref 11.5–14.5)
WBC: 6.7 10*3/uL (ref 3.8–10.6)

## 2014-01-21 LAB — APTT: ACTIVATED PTT: 45.5 s — AB (ref 23.6–35.9)

## 2014-01-21 LAB — TROPONIN I: Troponin-I: 0.02 ng/mL

## 2014-01-21 LAB — CK TOTAL AND CKMB (NOT AT ARMC)
CK, TOTAL: 143 U/L
CK-MB: 1.8 ng/mL (ref 0.5–3.6)

## 2014-01-21 LAB — PROTIME-INR
INR: 1.3
PROTHROMBIN TIME: 16.4 s — AB (ref 11.5–14.7)

## 2014-01-24 ENCOUNTER — Inpatient Hospital Stay: Payer: Self-pay | Admitting: Specialist

## 2014-01-24 LAB — COMPREHENSIVE METABOLIC PANEL
ALK PHOS: 58 U/L
ALT: 12 U/L — AB
Albumin: 2.8 g/dL — ABNORMAL LOW (ref 3.4–5.0)
Anion Gap: 12 (ref 7–16)
BUN: 99 mg/dL — ABNORMAL HIGH (ref 7–18)
Bilirubin,Total: 0.2 mg/dL (ref 0.2–1.0)
Calcium, Total: 8.8 mg/dL (ref 8.5–10.1)
Chloride: 100 mmol/L (ref 98–107)
Co2: 24 mmol/L (ref 21–32)
Creatinine: 3.29 mg/dL — ABNORMAL HIGH (ref 0.60–1.30)
EGFR (Non-African Amer.): 20 — ABNORMAL LOW
GFR CALC AF AMER: 24 — AB
Glucose: 164 mg/dL — ABNORMAL HIGH (ref 65–99)
OSMOLALITY: 306 (ref 275–301)
Potassium: 4.9 mmol/L (ref 3.5–5.1)
SGOT(AST): 22 U/L (ref 15–37)
Sodium: 136 mmol/L (ref 136–145)
TOTAL PROTEIN: 6.9 g/dL (ref 6.4–8.2)

## 2014-01-24 LAB — CBC
HCT: 26 % — ABNORMAL LOW (ref 40.0–52.0)
HGB: 8.2 g/dL — AB (ref 13.0–18.0)
MCH: 28.4 pg (ref 26.0–34.0)
MCHC: 31.7 g/dL — AB (ref 32.0–36.0)
MCV: 90 fL (ref 80–100)
Platelet: 315 10*3/uL (ref 150–440)
RBC: 2.9 10*6/uL — ABNORMAL LOW (ref 4.40–5.90)
RDW: 15.1 % — ABNORMAL HIGH (ref 11.5–14.5)
WBC: 7.9 10*3/uL (ref 3.8–10.6)

## 2014-01-24 LAB — TROPONIN I
TROPONIN-I: 1.3 ng/mL — AB
Troponin-I: 1.3 ng/mL — ABNORMAL HIGH

## 2014-01-24 LAB — CK TOTAL AND CKMB (NOT AT ARMC)
CK, TOTAL: 182 U/L
CK-MB: 8 ng/mL — ABNORMAL HIGH (ref 0.5–3.6)

## 2014-01-24 LAB — PROTIME-INR
INR: 1.9
PROTHROMBIN TIME: 21 s — AB (ref 11.5–14.7)

## 2014-01-24 LAB — CK-MB: CK-MB: 8.1 ng/mL — ABNORMAL HIGH (ref 0.5–3.6)

## 2014-01-24 LAB — HEMOGLOBIN: HGB: 7.7 g/dL — ABNORMAL LOW (ref 13.0–18.0)

## 2014-01-24 LAB — APTT: ACTIVATED PTT: 47.6 s — AB (ref 23.6–35.9)

## 2014-01-25 LAB — CBC WITH DIFFERENTIAL/PLATELET
BASOS ABS: 0.1 10*3/uL (ref 0.0–0.1)
Basophil %: 0.8 %
Eosinophil #: 0.1 10*3/uL (ref 0.0–0.7)
Eosinophil %: 1.3 %
HCT: 25.6 % — ABNORMAL LOW (ref 40.0–52.0)
HGB: 8.3 g/dL — ABNORMAL LOW (ref 13.0–18.0)
LYMPHS PCT: 26.6 %
Lymphocyte #: 2.6 10*3/uL (ref 1.0–3.6)
MCH: 29.1 pg (ref 26.0–34.0)
MCHC: 32.6 g/dL (ref 32.0–36.0)
MCV: 89 fL (ref 80–100)
MONOS PCT: 9 %
Monocyte #: 0.9 x10 3/mm (ref 0.2–1.0)
NEUTROS ABS: 6.1 10*3/uL (ref 1.4–6.5)
NEUTROS PCT: 62.3 %
PLATELETS: 293 10*3/uL (ref 150–440)
RBC: 2.86 10*6/uL — AB (ref 4.40–5.90)
RDW: 14.8 % — ABNORMAL HIGH (ref 11.5–14.5)
WBC: 9.8 10*3/uL (ref 3.8–10.6)

## 2014-01-25 LAB — PROTIME-INR
INR: 1.5
Prothrombin Time: 17.4 secs — ABNORMAL HIGH (ref 11.5–14.7)

## 2014-01-25 LAB — BASIC METABOLIC PANEL
Anion Gap: 12 (ref 7–16)
BUN: 100 mg/dL — ABNORMAL HIGH (ref 7–18)
CHLORIDE: 100 mmol/L (ref 98–107)
CREATININE: 3.66 mg/dL — AB (ref 0.60–1.30)
Calcium, Total: 8.5 mg/dL (ref 8.5–10.1)
Co2: 21 mmol/L (ref 21–32)
EGFR (African American): 22 — ABNORMAL LOW
EGFR (Non-African Amer.): 18 — ABNORMAL LOW
Glucose: 199 mg/dL — ABNORMAL HIGH (ref 65–99)
OSMOLALITY: 303 (ref 275–301)
Potassium: 5.3 mmol/L — ABNORMAL HIGH (ref 3.5–5.1)
Sodium: 133 mmol/L — ABNORMAL LOW (ref 136–145)

## 2014-01-25 LAB — TROPONIN I: Troponin-I: 1.4 ng/mL — ABNORMAL HIGH

## 2014-01-25 LAB — CK-MB: CK-MB: 6.6 ng/mL — AB (ref 0.5–3.6)

## 2014-01-25 LAB — HEMOGLOBIN
HGB: 8 g/dL — AB (ref 13.0–18.0)
HGB: 7.5 g/dL — ABNORMAL LOW (ref 13.0–18.0)
HGB: 8.2 g/dL — ABNORMAL LOW (ref 13.0–18.0)

## 2014-01-26 LAB — BASIC METABOLIC PANEL
Anion Gap: 11 (ref 7–16)
BUN: 103 mg/dL — AB (ref 7–18)
CHLORIDE: 104 mmol/L (ref 98–107)
Calcium, Total: 8 mg/dL — ABNORMAL LOW (ref 8.5–10.1)
Co2: 21 mmol/L (ref 21–32)
Creatinine: 3.22 mg/dL — ABNORMAL HIGH (ref 0.60–1.30)
EGFR (African American): 25 — ABNORMAL LOW
GFR CALC NON AF AMER: 21 — AB
Glucose: 282 mg/dL — ABNORMAL HIGH (ref 65–99)
Osmolality: 314 (ref 275–301)
POTASSIUM: 5.4 mmol/L — AB (ref 3.5–5.1)
Sodium: 136 mmol/L (ref 136–145)

## 2014-01-26 LAB — CK-MB
CK-MB: 3.5 ng/mL (ref 0.5–3.6)
CK-MB: 3.2 ng/mL (ref 0.5–3.6)

## 2014-01-26 LAB — TROPONIN I
Troponin-I: 0.82 ng/mL — ABNORMAL HIGH
Troponin-I: 0.81 ng/mL — ABNORMAL HIGH

## 2014-01-26 LAB — HEMOGLOBIN: HGB: 7.3 g/dL — AB (ref 13.0–18.0)

## 2014-01-27 LAB — BASIC METABOLIC PANEL
ANION GAP: 10 (ref 7–16)
BUN: 86 mg/dL — ABNORMAL HIGH (ref 7–18)
CHLORIDE: 106 mmol/L (ref 98–107)
CO2: 20 mmol/L — AB (ref 21–32)
CREATININE: 3.15 mg/dL — AB (ref 0.60–1.30)
Calcium, Total: 7.9 mg/dL — ABNORMAL LOW (ref 8.5–10.1)
EGFR (Non-African Amer.): 21 — ABNORMAL LOW
GFR CALC AF AMER: 26 — AB
GLUCOSE: 211 mg/dL — AB (ref 65–99)
OSMOLALITY: 304 (ref 275–301)
Potassium: 5.9 mmol/L — ABNORMAL HIGH (ref 3.5–5.1)
Sodium: 136 mmol/L (ref 136–145)

## 2014-01-27 LAB — CBC WITH DIFFERENTIAL/PLATELET
Basophil #: 0.1 10*3/uL (ref 0.0–0.1)
Basophil %: 0.7 %
EOS ABS: 0.1 10*3/uL (ref 0.0–0.7)
Eosinophil %: 0.5 %
HCT: 24 % — ABNORMAL LOW (ref 40.0–52.0)
HGB: 7.7 g/dL — AB (ref 13.0–18.0)
Lymphocyte #: 2 10*3/uL (ref 1.0–3.6)
Lymphocyte %: 16.2 %
MCH: 29.1 pg (ref 26.0–34.0)
MCHC: 32.1 g/dL (ref 32.0–36.0)
MCV: 91 fL (ref 80–100)
Monocyte #: 1.3 x10 3/mm — ABNORMAL HIGH (ref 0.2–1.0)
Monocyte %: 10.3 %
Neutrophil #: 8.9 10*3/uL — ABNORMAL HIGH (ref 1.4–6.5)
Neutrophil %: 72.3 %
Platelet: 285 10*3/uL (ref 150–440)
RBC: 2.65 10*6/uL — ABNORMAL LOW (ref 4.40–5.90)
RDW: 15.1 % — ABNORMAL HIGH (ref 11.5–14.5)
WBC: 12.3 10*3/uL — ABNORMAL HIGH (ref 3.8–10.6)

## 2014-01-28 LAB — CBC WITH DIFFERENTIAL/PLATELET
BASOS ABS: 0.1 10*3/uL (ref 0.0–0.1)
BASOS PCT: 0.6 %
EOS PCT: 2.3 %
Eosinophil #: 0.2 10*3/uL (ref 0.0–0.7)
HCT: 25 % — AB (ref 40.0–52.0)
HGB: 8.5 g/dL — ABNORMAL LOW (ref 13.0–18.0)
LYMPHS PCT: 17.7 %
Lymphocyte #: 1.7 10*3/uL (ref 1.0–3.6)
MCH: 30.6 pg (ref 26.0–34.0)
MCHC: 33.9 g/dL (ref 32.0–36.0)
MCV: 90 fL (ref 80–100)
MONOS PCT: 11.3 %
Monocyte #: 1.1 x10 3/mm — ABNORMAL HIGH (ref 0.2–1.0)
Neutrophil #: 6.6 10*3/uL — ABNORMAL HIGH (ref 1.4–6.5)
Neutrophil %: 68.1 %
PLATELETS: 267 10*3/uL (ref 150–440)
RBC: 2.77 10*6/uL — ABNORMAL LOW (ref 4.40–5.90)
RDW: 15.1 % — ABNORMAL HIGH (ref 11.5–14.5)
WBC: 9.8 10*3/uL (ref 3.8–10.6)

## 2014-01-28 LAB — BASIC METABOLIC PANEL
Anion Gap: 8 (ref 7–16)
BUN: 84 mg/dL — AB (ref 7–18)
Calcium, Total: 7.8 mg/dL — ABNORMAL LOW (ref 8.5–10.1)
Chloride: 108 mmol/L — ABNORMAL HIGH (ref 98–107)
Co2: 22 mmol/L (ref 21–32)
Creatinine: 3.02 mg/dL — ABNORMAL HIGH (ref 0.60–1.30)
EGFR (African American): 27 — ABNORMAL LOW
EGFR (Non-African Amer.): 22 — ABNORMAL LOW
Glucose: 160 mg/dL — ABNORMAL HIGH (ref 65–99)
Osmolality: 305 (ref 275–301)
Potassium: 4.1 mmol/L (ref 3.5–5.1)
SODIUM: 138 mmol/L (ref 136–145)

## 2014-01-29 LAB — BASIC METABOLIC PANEL
Anion Gap: 9 (ref 7–16)
BUN: 69 mg/dL — ABNORMAL HIGH (ref 7–18)
Calcium, Total: 8.1 mg/dL — ABNORMAL LOW (ref 8.5–10.1)
Chloride: 106 mmol/L (ref 98–107)
Co2: 22 mmol/L (ref 21–32)
Creatinine: 2.77 mg/dL — ABNORMAL HIGH (ref 0.60–1.30)
EGFR (Non-African Amer.): 25 — ABNORMAL LOW
GFR CALC AF AMER: 30 — AB
Glucose: 142 mg/dL — ABNORMAL HIGH (ref 65–99)
Osmolality: 296 (ref 275–301)
Potassium: 4.3 mmol/L (ref 3.5–5.1)
SODIUM: 137 mmol/L (ref 136–145)

## 2014-01-29 LAB — HEMOGLOBIN: HGB: 8 g/dL — ABNORMAL LOW (ref 13.0–18.0)

## 2014-01-30 LAB — BASIC METABOLIC PANEL
Anion Gap: 10 (ref 7–16)
BUN: 58 mg/dL — ABNORMAL HIGH (ref 7–18)
CALCIUM: 8.4 mg/dL — AB (ref 8.5–10.1)
Chloride: 107 mmol/L (ref 98–107)
Co2: 22 mmol/L (ref 21–32)
Creatinine: 2.48 mg/dL — ABNORMAL HIGH (ref 0.60–1.30)
EGFR (African American): 34 — ABNORMAL LOW
EGFR (Non-African Amer.): 28 — ABNORMAL LOW
Glucose: 132 mg/dL — ABNORMAL HIGH (ref 65–99)
Osmolality: 296 (ref 275–301)
Potassium: 4.4 mmol/L (ref 3.5–5.1)
Sodium: 139 mmol/L (ref 136–145)

## 2014-01-30 LAB — HEMOGLOBIN: HGB: 7.9 g/dL — ABNORMAL LOW (ref 13.0–18.0)

## 2014-01-30 LAB — PROTIME-INR
INR: 1.1
Prothrombin Time: 13.9 secs (ref 11.5–14.7)

## 2014-01-30 LAB — LIPASE, BLOOD: Lipase: 382 U/L (ref 73–393)

## 2014-02-02 ENCOUNTER — Emergency Department: Payer: Self-pay | Admitting: Emergency Medicine

## 2014-02-02 LAB — TROPONIN I: Troponin-I: 0.74 ng/mL — ABNORMAL HIGH

## 2014-02-02 LAB — COMPREHENSIVE METABOLIC PANEL
ALK PHOS: 261 U/L — AB
ALT: 292 U/L — AB
AST: 479 U/L — AB (ref 15–37)
Albumin: 3 g/dL — ABNORMAL LOW (ref 3.4–5.0)
Anion Gap: 14 (ref 7–16)
BUN: 55 mg/dL — AB (ref 7–18)
Bilirubin,Total: 1.9 mg/dL — ABNORMAL HIGH (ref 0.2–1.0)
CALCIUM: 8.7 mg/dL (ref 8.5–10.1)
CHLORIDE: 104 mmol/L (ref 98–107)
Co2: 19 mmol/L — ABNORMAL LOW (ref 21–32)
Creatinine: 4.33 mg/dL — ABNORMAL HIGH (ref 0.60–1.30)
EGFR (Non-African Amer.): 15 — ABNORMAL LOW
GFR CALC AF AMER: 18 — AB
Glucose: 167 mg/dL — ABNORMAL HIGH (ref 65–99)
Osmolality: 293 (ref 275–301)
Potassium: 5.3 mmol/L — ABNORMAL HIGH (ref 3.5–5.1)
SODIUM: 137 mmol/L (ref 136–145)
TOTAL PROTEIN: 6.9 g/dL (ref 6.4–8.2)

## 2014-02-02 LAB — CBC WITH DIFFERENTIAL/PLATELET
BASOS ABS: 0.1 10*3/uL (ref 0.0–0.1)
BASOS PCT: 0.5 %
EOS PCT: 0.2 %
Eosinophil #: 0 10*3/uL (ref 0.0–0.7)
HCT: 27.1 % — AB (ref 40.0–52.0)
HGB: 8.9 g/dL — AB (ref 13.0–18.0)
LYMPHS ABS: 0.7 10*3/uL — AB (ref 1.0–3.6)
LYMPHS PCT: 7.2 %
MCH: 29.8 pg (ref 26.0–34.0)
MCHC: 32.9 g/dL (ref 32.0–36.0)
MCV: 91 fL (ref 80–100)
MONO ABS: 0.7 x10 3/mm (ref 0.2–1.0)
MONOS PCT: 7.2 %
Neutrophil #: 8.5 10*3/uL — ABNORMAL HIGH (ref 1.4–6.5)
Neutrophil %: 84.9 %
PLATELETS: 380 10*3/uL (ref 150–440)
RBC: 2.99 10*6/uL — ABNORMAL LOW (ref 4.40–5.90)
RDW: 16 % — AB (ref 11.5–14.5)
WBC: 10.1 10*3/uL (ref 3.8–10.6)

## 2014-02-02 LAB — CK-MB: CK-MB: 3.9 ng/mL — AB (ref 0.5–3.6)

## 2014-02-02 LAB — PRO B NATRIURETIC PEPTIDE: B-TYPE NATIURETIC PEPTID: 6600 pg/mL — AB (ref 0–125)

## 2014-02-02 LAB — TSH: Thyroid Stimulating Horm: 2.38 u[IU]/mL

## 2014-02-09 ENCOUNTER — Ambulatory Visit: Payer: Self-pay | Admitting: Internal Medicine

## 2014-02-09 DEATH — deceased

## 2014-07-29 NOTE — Discharge Summary (Signed)
PATIENT NAME:  Parker Robbins, Parker Robbins MR#:  [id number] DATE OF BIRTH:  Oct 15, 1950  DATE OF ADMISSION:  01/29/2012 DATE OF DISCHARGE:  01/31/2012  DISCHARGE DIAGNOSES:  1. Unstable angina, now resolved. Could be due to upper respiratory tract infection and/or atrial flutter. 2. Atrial flutter with junctional rhythm, on beta-blocker. Rate is controlled. Started on Xarelto per Cardiology recommendation.  3. Accelerated hypertension, now resolved. 4. Diabetes. May need to start on glipizide as an outpatient. Metformin stopped due to kidney disease. 5. 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  6. Hypomagnesemia, repleted and resolved.  7. Cough, likely viral upper respiratory tract infection, on cough medicine.   SECONDARY DIAGNOSES:  1. History of myocardial infarction.  2. Hypertension.  3. Gout.  4. Gastroesophageal reflux disease. 5. Diabetes.  6. Hyperlipidemia.  7. Coronary artery disease, status post coronary artery bypass graft.  8. Sleep apnea.  9. Obesity.   CONSULTATION: Cardiology, Dr. Juliann Pares   PROCEDURES/RADIOLOGY:  1. Chest x-ray on October 20th showed findings suggestive of low-grade compensated CHF. No focal pneumonia.  2. Myoview on October 22nd was negative.  3. CT scan of chest without contrast on October 22nd showed prominent mediastinal lymph node which may be reactive. Cardiomegaly. Possible pulmonary edema. Interstitial fibrotic changes within the lung bases.   HISTORY AND SHORT HOSPITAL COURSE: The patient is a 64 year old male with the above-mentioned medical problems who was admitted for unstable angina. The patient was also found to be in atrial flutter with junctional rhythm. Please see Dr. Corene Cornea dictated history and physical for further details. Cardiology consultation was obtained with Dr. Juliann Pares who recommended Myoview which was performed and was negative. The patient remained chest pain free. He did have a lot of coughing for  which cough medicine was added. He was found to have chronic kidney disease and his metformin was stopped considering poor creatinine clearance. Cardiology also recommended starting Xarelto considering atrial flutter. The patient's blood pressure was slowly improving and was close to his baseline and was discharged home in stable condition on October 22nd.   On the date of discharge, his vital signs were as follows: Temperature 97.6, heart rate 66 per minute, respirations 20 per minute, blood pressure 135/82 mmHg. He was saturating 93% on 2 liters oxygen via nasal cannula.   PERTINENT PHYSICAL EXAMINATION ON THE DATE OF DISCHARGE: CARDIOVASCULAR: S1, S2 normal. No murmurs, rubs, or gallop. LUNGS: Clear to auscultation bilaterally. No wheezing, rales, rhonchi, or crepitation. ABDOMEN: Soft, benign. NEUROLOGIC: Nonfocal examination. All other physical examination remained at baseline.   DISCHARGE MEDICATIONS:  1. Enalapril 10 mg p.o. b.i.d.   2. Klor-Con 10 mEq p.o. b.i.d.  3. Isosorbide mononitrate 30 mg p.o. daily.  4. Simvastatin 40 mg p.o. daily.  5. Furosemide 20 mg p.o. b.i.d.  6. Magnesium oxide 400 mg p.o. daily.  7. Stool softener once daily. 8. Apidra sliding scale as needed.  9. Insulin Lantus 35 units sub-Q at bedtime.  10. Percocet 5/325 mg 1 to 2 tablets p.o. every four hours as needed.  11. Flexeril 10 mg p.o. 3 times a day as needed.  12. Acetaminophen/oxycodone 325/10 mg 1 tablet p.o. every six hours as needed.   13. Xarelto 15 mg p.o. daily.  14. Tussionex 5 mL p.o. b.i.d. for five days as needed.  15. Metoprolol 50 mg p.o. b.i.d.  16. Aspirin 81 mg p.o. daily.   DISCHARGE DIET: Low sodium 100 ADA.   DISCHARGE ACTIVITY: As tolerated.  DISCHARGE INSTRUCTIONS AND FOLLOW-UP:  1. The patient was instructed to follow-up with his primary care physician, Dr. Maryellen PileEason, in 1 to 2 weeks.  2. He will need follow-up with Dr. Juliann Paresallwood from Cardiology in 2 to 4 weeks and with kidney  specialist in 2 to 4 weeks for kidney function evaluation.  3. He was instructed to stop metformin considering his poor creatinine clearance and change his aspirin from 325 to only 81 mg a day considering him being started on Xarelto.  4. He will need CBC and basic metabolic panel check in one week with results forwarded to primary care physician and [facility] specialist.   TOTAL TIME DISCHARGING THIS PATIENT: 55 minutes.   ____________________________ Ellamae SiaVipul S. Sherryll BurgerShah, MD vss:drc D: 02/02/2012 15:00:10 ET T: 02/03/2012 09:43:51 ET JOB#: [id number]  cc: Trashaun Streight S. Sherryll BurgerShah, MD, <Dictator> Serita ShellerErnest B. Maryellen PileEason, MD Dwayne D. Juliann Paresallwood, MD [facility] Ellamae SiaVIPUL S [facility] MD ELECTRONICALLY SIGNED 02/03/2012 11:27

## 2014-07-29 NOTE — Consult Note (Signed)
PATIENT NAME:  Parker Robbins, Parker Robbins MR#:  [id number] DATE OF BIRTH:  1950/06/22  DATE OF CONSULTATION:  01/31/2012  REFERRING PHYSICIAN:  [facility] CONSULTING PHYSICIAN:  Dwayne D. Juliann Pares, MD PRIMARY CARE PHYSICIAN: Toy Cookey, MD    INDICATION: Atypical chest pain and atrial fibrillation.   HISTORY OF PRESENT ILLNESS: Mr. Ahlgren is a 64 year old African American male with morbid obesity, diabetes, hypertension, coronary artery disease, hyperlipidemia, gastroesophageal reflux disease, sleep apnea, reflux, presented with chest pain symptoms, found to be in atrial fibrillation/atrial flutter with rate control. He had atypical chest pain symptoms off and on and was evaluated in the Emergency Room and was subsequently admitted. A functional study was then recommended.   REVIEW OF SYSTEMS: No blackout spells or syncope. No nausea, vomiting. No fever, no chills, no sweats. No weight loss. No weight gain. No hemoptysis or hematemesis. No bright red blood per rectum.   PAST MEDICAL HISTORY:  1. Myocardial infarction x3.  2. Hypertension. 3. Gout. 4. Reflux. 5. Diabetes.  6. Hyperlipidemia.  7. Coronary artery disease. 8. Sleep apnea. 9. Obesity.   PAST SURGICAL HISTORY:  1. Nose surgery. 2. Ganglion removal of the wrist.  3. Left arm surgery. 4. Coronary artery bypass. 5. Back surgery. 6. Right knee surgery.  7. Uvula surgery.  8. Left ulna neuropathy surgery.  9. Right groin surgery from infection. 10. Angioplasty and stenting.   ALLERGIES: Penicillin.   MEDICATIONS:  1. Enalapril 10 mg b.i.d.  2. Potassium chloride 10 mEq b.i.d.   3. Lopressor 100 mg b.i.d. 4. Metformin 500 mg b.i.d.   5. Imdur 30 mg a day.  6. Simvastatin 40 mg a day.  7. Lasix 20 mg b.i.d.   8. Mag oxide 400 mg once a day.  9. Stool softener p.r.n.  10. Aspirin 325 mg daily.  11. Apidra 100 units sliding scale. 12. Lantus 35 units at bedtime.  13. Percocet 5/325 mg every 4 hours p.r.n.  14. Ibuprofen  500 every 8 hours p.r.n.  15. Flexeril 10 mg t.i.d. p.r.n.   SOCIAL HISTORY: Married. Quit smoking. Retired. No alcohol consumption.   FAMILY HISTORY: Diabetes, hypertension, coronary artery disease.   PHYSICAL EXAMINATION:  VITAL SIGNS: Blood pressure elevated at 200/100, pulse of 70 and irregular, respiratory rate of 18, afebrile.   HEENT: Normocephalic, atraumatic. Pupils are equal and reactive to light.   NECK: Supple. No significant jugular venous distention, bruits, or adenopathy.   LUNGS: Clear to auscultation and percussion. No significant wheeze, rhonchi, or rale.   HEART: Irregularly irregular. Systolic ejection murmur at left sternal border. PMI is nondisplaced.   ABDOMEN: Exam is benign. Positive bowel sounds. No rebound, guarding, tenderness.   EXTREMITIES: Within normal limits. No significant cyanosis, clubbing, or edema.   NEUROLOGIC: Exam is grossly intact.   SKIN: Exam is normal.   LABORATORY, DIAGNOSTIC AND RADIOLOGICAL DATA: White count 7, hemoglobin 12.9, hematocrit 38, platelet count 234. Troponin 0.02. Glucose 187, BUN 28, creatinine 1.97, sodium 142, potassium 4.3. LFTs negative. EKG: Atrial fibrillation/flutter, controlled rate at about 70, left ventricular hypertrophy with nonspecific findings.   ASSESSMENT:  1. Atrial fibrillation.  2. Hypertension.  3. Hyperlipidemia.  4. Diabetes.  5. Coronary artery disease.  6. Obstructive sleep apnea.  7. Morbid obesity.   PLAN:  1. Recommend current therapy. Place on telemetry. I agree with functional study for atypical chest pain. I would consider continued therapy with Imdur, beta blocker for coronary artery disease. I would also consider atrial fibrillation anticoagulation. For now, I  will add anticoagulation with Xarelto for long-term therapy. He has had atrial fibrillation paroxysmally in the past but has been controlled medically. I do not recommend antiarrhythmic at this point, will just control the rate  for now.  2. Continue blood pressure control.  3. Continue weight loss and exercise.  4. Sleep apnea: Control with CPAP.  5. Continue diabetes monitoring and control.  6. Continue lipid management.  7. Again, we will treat for anticoagulation for now to see how the patient responds. If he does reasonably well, we will continue rate control and anticoagulation and not proceed with cardioversion at this point.   ____________________________ Bobbie Stackwayne D. Juliann Paresallwood, MD ddc:cbb D: 01/31/2012 10:53:07 ET T: 01/31/2012 11:43:01 ET JOB#: [id number]  cc: Dwayne D. Juliann Paresallwood, MD, <Dictator> Alwyn PeaWAYNE D CALLWOOD MD ELECTRONICALLY SIGNED 03/03/2012 10:14

## 2014-07-29 NOTE — H&P (Signed)
PATIENT NAME:  Parker Robbins, Parker Robbins MR#:  [id number] DATE OF BIRTH:  [birth year]  DATE OF ADMISSION:  01/29/2012  ER REFERRING PHYSICIAN: Si Raider, MD  PRIMARY CARE PHYSICIAN: Toy Cookey, MD / Also sees Dr. Sherrie Mustache for diabetes  CARDIOLOGIST: Dorothyann Peng, MD  CHIEF COMPLAINT: Chest pain.   HISTORY OF PRESENT ILLNESS: The patient is a 64 year old male with past medical history of diabetes, hypertension, and coronary artery disease status post coronary artery bypass graft and multiple other medical problems who had CABG in 2007. He has not had any catheterization since then. His last stress test was more than one year ago, which was done by Dr. Juliann Pares. The patient reports that since his bypass he has been overall doing well when around 1:30 p.m. today he developed left-sided chest pain associated with shortness of breath, diaphoresis, and nausea. The pain felt like pressure and radiated into his back, neck, jaw, and left upper extremity. He reports that he has had three myocardial infarctions in the past and this felt like the chest pain that he had during those myocardial infarctions.  PAST MEDICAL HISTORY:  1. Myocardial infarction x3.  2. Hypertension. 3. Gout. 4. Gastroesophageal reflux disease.  5. Diabetes.  6. Hyperlipidemia.  7. Coronary artery disease status post CABG.  Prior to the CABG, he had multiple stents. 8. Sleep apnea.  9. Status post uvula surgery. 10. Obesity.      PAST SURGICAL HISTORY:  1. Nose surgery. 2. Ganglion removal of right wrist. 3. Left arm surgery. 4. Coronary artery bypass graft. 5. Back surgery. 6. Right knee surgery.  7. Uvula surgery. 8. 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 9. Infected right groin hematoma status post drainage.   DRUG ALLERGIES: Penicillin.   CURRENT MEDICATIONS:  1. Enalapril 10 mg twice a day. 2. Joyce Gross Ciel 10 milliequivalents twice a day. 3. Lopressor 100 mg twice  a day. 4. Metformin 500 mg twice a day. 5. Imdur 30 mg daily.  6. Simvastatin 40 mg daily.  7. Lasix 20 mg twice a day.  8. Magnesium oxide 400 mg once a day.  9. Stool softener once a day. 10. Aspirin 325 mg daily.  11. Apidra 100 units sliding scale. 12. Lantus 35 units subcutaneously at bedtime.  13. Percocet 5/325 mg one to two tablets every four hours p.r.n.  14. Ibuprofen 600 mg every eight hours p.r.n.  15. Flexeril 10 mg three times daily as needed for muscle spasms.   SOCIAL HISTORY: The patient reports that he quit smoking and alcohol more than 12 years ago. He is married and lives with his wife.   FAMILY HISTORY: Mother had diabetes. Father had heart disease.  REVIEW OF SYSTEMS: CONSTITUTIONAL: Denies any fever. Reports fatigue and weakness. EYES: Denies any blurred or double vision. ENT: Denies any tinnitus or ear pain. RESPIRATORY: Denies any cough or painful respirations. CARDIOVASCULAR: Reports chest pain. Denies any edema. GI: Denies any nausea, vomiting, diarrhea, or abdominal pain. GU: Denies any dysuria or hematuria. ENDOCRINE: Denies any polyuria or nocturia. HEME/LYMPH: Denies any anemia or easy bruisability. INTEGUMENT: Denies acne or rash. MUSCULOSKELETAL: Denies any swelling or gout. NEUROLOGICAL: Denies any numbness or weakness. PSYCH: Denies any anxiety or depression.   PHYSICAL EXAMINATION:   VITAL SIGNS: Temperature afebrile, heart rate 59, respiratory rate 20, blood pressure 228/118, and pulse oximetry 98% on room air.   GENERAL: A 64 year old male, morbidly obese, sitting comfortably in bed.   HEAD: Atraumatic, normocephalic.   EYES:  There is some pallor. No icterus or cyanosis. Pupils are equally round and reactive to light and accommodation. Extraocular movements intact.   ENT: Wet mucous membranes. No oropharyngeal erythema or thrush.   NECK: Supple. No masses. No JVD. No thyromegaly or lymphadenopathy.   CHEST WALL: There is tenderness to palpation  anteriorly on the chest wall. The patient also has tenderness to palpation on the back adjacent to the vertebrae on the left side. Not using accessory muscles of respiration. No intercostal muscle retractions.   LUNGS: Bilaterally clear to auscultation. No wheezing, rales, or rhonchi.   CARDIOVASCULAR: S1 and S2 irregular. No murmurs, rubs, or gallops.   ABDOMEN: Soft, obese, and nontender. No guarding or rigidity. Normal bowel sounds. Exam was difficult due to body habitus.   SKIN: No rash or lesions.   PERIPHERIES: Trace pedal edema, 1+ pedal pulses.   MUSCULOSKELETAL: No cyanosis or clubbing.   NEUROLOGIC: Awake, alert, and oriented x3. Nonfocal neurological exam. Cranial nerves grossly intact.   PSYCH: Normal mood and affect.   RESULTS: Chest x-ray shows low-grade compensated congestive heart failure, no focal pneumonia.   White count 7.1, hemoglobin 12.9, hematocrit 38.4, and platelets 234. Troponin 0.02. Glucose 187, BUN 28, creatinine 1.97, sodium 142, potassium 4.3, chloride 107, CO2 24, calcium 8.3, total bilirubin 0.7, and alkaline phosphatase 61. The rest of the LFTs are normal. Lipase 1.7. Magnesium 0.4.   EKG shows junctional rhythm/atrial flutter.   ASSESSMENT AND PLAN: A 64 year old male with history of coronary artery disease status post coronary artery bypass graft, diabetes, and hypertension who presents with left-sided chest pain with radiation into the back and left upper extremity. 1. Unstable angina. The patient does have reproducible tenderness to palpation on his back and anterior chest wall, therefore, he may have musculoskeletal component, but he says that the chest pressure feels similar to his prior MIs. Since he is also in atrial flutter, we will start him on a heparin drip, continue his aspirin, beta blockers, statin, and ACE inhibitor. We will obtain cardiology consult and inpatient stress test. 2. Atrial flutter/junctional rhythm. Hypomagnesemia with a  potassium of 0.4 may be contributing. We will replace IV. We will recheck a magnesium tomorrow morning. We will continue his beta blocker, rate is controlled. We will start heparin drip, if the patient does not convert to normal sinus rhythm. We will await further cardiology recommendation and anticoagulation.  3. Accelerated hypertension. The patient's blood pressure was severely elevated on presentation. This could be related to acute stress. We will continue his current medications for the time being and also place on p.r.n. hydralazine. We will adjust medications to achieve good hypertensive control.  4. Diabetes. We will continue the patient's Lantus and place on insulin sliding scale and ADA diet. We will hold his metformin in case he needs catheterization. 5. Chronic kidney disease. The patient's creatinine is elevated as compared to his prior levels. We will hold his Lasix and give gentle IV fluid hydration in case he requires catheterization.  6. Hypomagnesemia, severe. Magnesium level is only 0.4. We will replace IV and increase the dose of his oral supplementation.  7. Hyperlipidemia. We will continue statin therapy and check fasting lipid profile.  I reviewed old medical records, discussed with the patient, AND discussed with the ED physician.  TIME SPENT: 75 minutes.  ____________________________ Darrick MeigsSangeeta Krystiana Fornes, MD sp:slb D: 01/29/2012 18:56:03 ET T: 01/30/2012 07:40:22 ET JOB#: [id number]  cc: Darrick MeigsSangeeta Kavian Peters, MD, <Dictator> Serita ShellerErnest B. Maryellen PileEason, MD Marlyn CorporalFayegh H. Jadali, MD  Darrick Meigs MD ELECTRONICALLY SIGNED 01/30/2012 12:07

## 2014-08-01 NOTE — Discharge Summary (Signed)
PATIENT NAME:  Parker Robbins, [name] MR#:  [id number] DATE OF BIRTH:  Apr 04, 1951  DATE OF ADMISSION:  01/20/2013 DATE OF DISCHARGE:  01/22/2013  ADMISSION DIAGNOSES: Chest pain.  DISCHARGE DIAGNOSES: 1. Atypical chest pain, resolved. 2. Hypertension.  3. Hyperlipidemia.  4. Gastroesophageal reflux disease.  5. Diabetes.  6. Diabetic neuropathy.  7. Chronic atrial fibrillation with atrial flutter.   Myoview scan results were negative. No significant wall motion abnormality. Pharmacology myocardial perfusion with no significant ischemia. Ejection fraction of 54%. Left ventricular global function was normal. EKG no changes concerning of ischemia.   OTHER RESULTS: Creatinine 2.3, which is about his baseline, glucose 136.  Magnesium was 0.6, it was repleted. Troponins were negative x 4.  His white count was 5.8, hemoglobin was 12. INR was 1.7. His EKG did not show any significant acute changes.   The patient was admitted on 01/20/2013 due to significant chest pain, consulted by Dr. fat and the patient describes having pain that began bit early in the morning of the day of admission, centered over chest, radiated to the neck for what he decided to come to the ER. He was evaluated, admitted. A cardiology consult was done. Cardiology was concerned about risk factors of the patient. The patient has atrial fibrillation for which he is taking Xarelto. He has coronary artery disease, status post CABG. He has chronic kidney disease. He had atrial flutter on the EKG the, but his heart rate was controlled.   The patient underwent  a nuclear stress test of 2 day protocol due to his obesity, and the results are seen above as normal. The patient did well during this hospitalization. There were not any other issues going on.   The patient is discharged with the following medications: Klor-Con 10 mg twice daily, aspirin 325 mg daily, magnesium oxide 400 mg once a day, simvastatin 40 mg once a day, enalapril 10 mg twice  daily, Xarelto 20 mg once a day, furosemide 20 mg twice a day, metoprolol 100 mg 2 times a day, isosorbide 30 mg once a day, omeprazole 20 mg daily, gabapentin 300 mg 3 times a day. novolog  20 units 2 times a day, Levemir 45 units at bedtime, Tylenol as needed for fever or pain.   FOLLOW-UP: Dr. Harold HedgeKenneth Fath, Dr. Toy CookeyErnest Eason.    TIME SPENT: I spent about 40 minutes with this discharge on discharge day.  ____________________________ Felipa Furnaceoberto Sanchez Gutierrez, MD rsg:sg D: 01/24/2013 07:11:41 ET T: 01/24/2013 07:48:21 ET JOB#: [id number]  cc: Felipa Furnaceoberto Sanchez Gutierrez, MD, <Dictator> Serita ShellerErnest B. Maryellen PileEason, MD Darlin PriestlyKenneth A. Lady GaryFath, MD Regan RakersOBERTO Juanda ChanceSANCHEZ GUTIERRE MD ELECTRONICALLY SIGNED 02/02/2013 22:56

## 2014-08-01 NOTE — H&P (Signed)
PATIENT NAME:  Parker Robbins, Parker Robbins MR#:  [id number] DATE OF BIRTH:  27-Nov-1950  DATE OF ADMISSION:  01/20/2013  PRIMARY CARE PHYSICIAN: Dr. Maryellen PileEason.  CARDIOLOGIST: Dr. Juliann Paresallwood.   CHIEF COMPLAINT: Chest pain.   HISTORY OF PRESENT ILLNESS: This is a 64 year old male who presents to the hospital complaining of chest pain that began earlier this morning. He describes the pain as being in the center of his chest, radiating to his neck and also down his left arm. The pain has been intermittently happening for the past few days. It lasts anywhere from a few seconds to a couple of minutes. The patient does complain of shortness of breath and some nausea, but no vomiting with it. No diaphoresis, no palpitations, no syncope or dizziness associated with symptoms. Since the symptoms are becoming more frequent, he came to the ER for further evaluation. Hospitalist services were contacted for further treatment and evaluation.   REVIEW OF SYSTEMS:    CONSTITUTIONAL: No documented fever. No weight gain or weight loss.  EYES: No blurred or double vision.  EARS, NOSE, THROAT: No tinnitus. No postnasal drip. No redness of the oropharynx.  RESPIRATORY: No cough, no wheeze, no hemoptysis, no dyspnea.  CARDIOVASCULAR: Positive chest pain. No orthopnea, no palpitations, no syncope.  GASTROINTESTINAL: Positive nausea. No vomiting. No diarrhea. No abdominal pain. No melena or hematochezia.  GENITOURINARY: No dysuria, no hematuria.  ENDOCRINE: No polyuria or nocturia. No heat or cold intolerance.  HEMATOLOGIC: No anemia. No bruising. No bleeding.  INTEGUMENTARY: No rashes. No lesions.  MUSCULOSKELETAL: No arthritis. No swelling. No gout.  NEUROLOGIC: No numbness. No tingling. No ataxia. No seizure-type activity.  PSYCHIATRIC: No anxiety. No insomnia. No ADD.   PAST MEDICAL HISTORY: Consistent with history of coronary artery disease, status post bypass, morbid obesity, hypertension, hyperlipidemia, GERD, diabetes, diabetic  neuropathy, history of chronic atrial fibrillation/flutter.   ALLERGIES: PENICILLIN, WHICH CAUSES A RASH.   SOCIAL HISTORY: Used to be a smoker, but quit about 10 to 12 years ago. Does have a 20 pack-year smoking history. Also used to drink heavily, but quit 20 years ago. No other illicit drug abuse. Lives at home with his wife.   FAMILY HISTORY: Mother died from complications of heart disease. The patient also has a brother who has heart disease.   CURRENT MEDICATIONS: Aspirin 325 mg daily, magnesium oxide 400 mg daily, enalapril 20 mg daily, simvastatin 40 mg daily, Lasix 20 mg b.i.d., metoprolol tartrate 100 mg b.i.d., Imdur 30 mg daily, omeprazole 40 mg daily, gabapentin 300 mg t.i.d., Naprosyn 500 mg b.i.d., Levemir 45 units at bedtime, Apidra 20 units t.i.d. with meals and Xarelto 20 mg daily.   PHYSICAL EXAMINATION: VITAL SIGNS: Temperature 97.8, pulse 93, respirations 22, blood pressure 136/86, sats 95% on room air.  GENERAL: He is a pleasant-appearing male in no apparent distress.  HEAD, EYES, EARS, NOSE, THROAT: He is atraumatic, normocephalic. Extraocular muscles are intact. Pupils are equal and reactive to light. Sclerae anicteric. No conjunctival injection. No pharyngeal erythema.  NECK: Supple. There is no jugular venous distention. No bruits. No lymphadenopathy or thyromegaly.  HEART: Irregular. No murmurs, no rubs, no clicks.  LUNGS: Clear to auscultation bilaterally. No rales, no rhonchi, no wheezes.  ABDOMEN: Soft, flat, nontender, nondistended. Has good bowel sounds. No hepatosplenomegaly appreciated.  EXTREMITIES: No evidence of any cyanosis, clubbing or peripheral edema. Has +2 pedal and radial pulses bilaterally.  NEUROLOGICAL: The patient is alert, awake, oriented x 3 with no focal motor or sensory deficits appreciated  bilaterally.  SKIN: Moist and warm with no rashes appreciated.  LYMPHATIC: There is no cervical or axillary lymphadenopathy.   LABORATORY AND DIAGNOSTIC  DATA: Serum glucose of 136, BUN 35, creatinine 2.6, sodium 139, potassium 3.8, chloride 104, bicarbonate 30. Troponin less than 0.02. White cell count 5.8, hemoglobin 12.9, hematocrit 38.7, platelet count 232. INR is 1.7.   The patient's EKG shows atrial flutter with a left axis deviation, but no other acute ST or T wave changes.   ASSESSMENT AND PLAN: This is a 64 year old male with a history of coronary artery disease, status post bypass, morbid obesity, hypertension, hyperlipidemia, gastroesophageal reflux disease, diabetic neuropathy, history of chronic atrial fibrillation/flutter, diabetes, who  presents to the hospital due to chest pain.  1.  Chest pain: The patient does have significant risk factors given his morbid obesity, diabetes and history of coronary artery disease. I will therefore observe him overnight on telemetry, follow serial cardiac markers. His first set of troponins are negative. His EKG does not show any evidence of acute ST changes. I will continue aspirin, metoprolol, statin, morphine, nitroglycerin and oxygen. I will get a cardiology consult. The patient is followed by Dr. Juliann Pares. He did have a Myoview in October of last year, which was normal; therefore, I will not repeat a stress test at this time. Will have cardiology first further evaluate him. If he rules in by cardiac markers, he may need a cardiac catheterization.  2.  Hypertension: The patient is presently hemodynamically stable. I will continue his lisinopril, metoprolol and Imdur.  3.  Hyperlipidemia: Continue simvastatin. 4.  Gastroesophageal reflux disease: Continue omeprazole.  5.  Diabetes: Continue Levemir and Apidra with meals. Continue carb-controlled diet. Follow blood sugars. 6.  Diabetic neuropathy: Continue Neurontin.  7.  Chronic atrial fibrillation/flutter: The patient is rate controlled. Continue with his metoprolol. The patient is already on Xarelto.   CODE STATUS: The patient is a full code.    TIME SPENT WITH ADMISSION: 50 minutes.    ____________________________ Rolly Pancake. Cherlynn Kaiser, MD vjs:jm D: 01/20/2013 14:15:23 ET T: 01/20/2013 14:57:38 ET JOB#: [id number]  cc: Rolly Pancake. Cherlynn Kaiser, MD, <Dictator> Houston Siren MD ELECTRONICALLY SIGNED 01/22/2013 11:16

## 2014-08-01 NOTE — Consult Note (Signed)
   Present Illness 64 yo male with history of chronic afib treated with rate control and Xarelto for anticoagulation, history of cad s/p cabg, history of renal insuffiency who was admitted after presenting to the er with chest pain. He has ruled out for an mi thus far. EKG revealed atrial flutter with 4:1 block with no ichemia. He describes the pain as brief episodes of discomfort occurring at rest. He states the episodes increased slowly in frequency and length. On day of admission he states the discomfort lasted longer and he presented for evaluation. Pain is not with exertion and is worsened with deep palpation. His renal function is somewhat worsened from previously. Creatinine was 2.0 one year ago. Creatinine has worsened since than to 2.89. GFR is 24. His discomfort has improved since admission. He had a negative funcitonal study 1 year ago.   Physical Exam:  GEN obese   HEENT PERRL, hearing intact to voice   NECK supple   RESP clear BS  no use of accessory muscles   CARD Regular rate and rhythm  Normal, S1, S2  No murmur   ABD no hernia  normal BS   LYMPH negative neck, negative axillae   EXTR negative cyanosis/clubbing, negative edema   SKIN normal to palpation   NEURO cranial nerves intact, motor/sensory function intact   PSYCH A+O to time, place, person   Review of Systems:  Subjective/Chief Complaint chest pain   General: No Complaints   Skin: No Complaints   ENT: No Complaints   Eyes: No Complaints   Neck: No Complaints   Respiratory: No Complaints   Cardiovascular: Chest pain or discomfort   Gastrointestinal: No Complaints   Genitourinary: No Complaints   Musculoskeletal: anaterior chest wall pain   Neurologic: No Complaints   Hematologic: No Complaints   Endocrine: No Complaints   Psychiatric: No Complaints   Review of Systems: All other systems were reviewed and found to be negative   Medications/Allergies Reviewed Medications/Allergies  reviewed   EKG:  Interpretation atrial flutter with 4:1 block. no ischemia    Penicillin: N/V, Hives   Impression 64 yo male with history of cad s/p cabg, history of atrial flutter treated with rate control and anticoagulation with xarelto at 20 mg daily as out patient who was admitted with complaints of chest pain atypical from his angina. He has ruled out for an mi. Pain is worsened with deep palpation. Renal funciton has worsened. Will defer ardiac cath at present due to renal insuffiency and atypical discomfort. Had negative funcitonal study one year ago. Will repeat funcitonal study this admission and determine if there is need for invasive evalaution. Gentle hydration to improve renal function. Agree with reducing xarelto to 15 long term but will hold at present as gfr is 28.   Plan 1. Hold xarelto 2. Conitneu with other meds 3. Funcitonal study to determine for evidence of ischemia 4. Weight loss 5. Further recs pending course and outcome of functinoal study.   Electronic Signatures: Dalia HeadingFath, Charels Stambaugh A (MD)  (Signed 13-Oct-14 07:38)  Authored: General Aspect/Present Illness, History and Physical Exam, Review of System, EKG , Allergies, Impression/Plan   Last Updated: 13-Oct-14 07:38 by Dalia HeadingFath, Karley Pho A (MD)

## 2014-08-02 NOTE — H&P (Signed)
PATIENT NAME:  Parker Robbins, Parker Robbins MR#:  [id number] DATE OF BIRTH:  [birth year]  DATE OF ADMISSION:  12/14/2013  PRIMARY CARE PHYSICIAN: Dr. Toy Cookey.   REFERRING PHYSICIAN:  Kem Boroughs, NP  CHIEF COMPLAINT: Chest pain.   HISTORY OF PRESENT ILLNESS: Parker Robbins is a 64 year old morbidly obese male with history of coronary artery disease status post coronary artery bypass grafting, had a recent admission end of August 2015 for chest pain. The patient was found to have mild elevation of the troponins of 0.17. The patient was treated with heparin. The patient had elevated creatinine of 2.7. Concerning this, the decision was made to treat him medically. The patient was recommended to follow up with Dr. Juliann Pares as an outpatient. The patient states however that this afternoon, the patient was driving and around 4 p.m., started to experience pain in the lower part of the sternum. No radiation tightness in the chest similar to the pain that he experienced during the previous admission. Felt slightly lightheaded, mildly nauseated. Concerning this, came to the Emergency Department. Work-up in the Emergency Department, the patient is found to have    troponin of 0.1; however, has a normal CK-MB of 2.8. The patient currently denies having any chest pain.   PAST MEDICAL HISTORY:  1. Coronary artery status post coronary artery bypass grafting on stent placement.  2. Hypertension.  3. Obesity.  4. Obstructive sleep apnea.  5. Gout.  6. Gastroesophageal reflex disease.  7. Diabetes mellitus.  8. Hyperlipidemia.  9. Chronic kidney disease stage III.   ALLERGIES: PENICILLIN.   HOME MEDICATIONS:  1. Xarelto 20 mg once a day.  2. Torsemide 100 mg once a day.  3. Simvastatin 40 mg once a day. 4. Omeprazole 20 mg once a day.  5. Naprosyn 500 mg 2 times a day.  6. Metoprolol 100 mg 2 times a day.  7. Magnesium oxide 400 mg once a day.  8. Levemir 45 units once a day.  9. Imdur 30 mg once a day.  10.  Gabapentin 300 mg 3 times a day.  11. Enalapril 10 mg 2 times a day.  12. Flexeril 10 mg every 8 hours as needed.  13. Aspirin 81 mg daily.  14. Apidra 20 units subcutaneous 3 times a day.   SOCIAL HISTORY: No history of tobacco, alcohol or drug use. Married, lives with his wife.   PAST SURGICAL HISTORY:  1. Uvuloplasty.  2. Knee arthroplasty.  3. L4-L5 fusion.   FAMILY HISTORY: Positive for coronary artery disease.   REVIEW OF SYSTEMS:   CONSTITUTIONAL: Experiences generalized weakness.  EYES: No change in vision.  EARS, NOSE AND THROAT: No change in hearing.  RESPIRATORY: No cough or shortness of breath.  CARDIOVASCULAR: Has chest pain.  GASTROINTESTINAL: No nausea, vomiting, abdominal pain.  GENITOURINARY: No dysuria or hematuria.  HEMATOLOGIC: No easy bruising or bleeding.   SKIN: No rash or lesions.  MUSCULOSKELETAL: Has no joint pains and aches.  NEUROLOGIC: No weakness or numbness in any part of the body.   PHYSICAL EXAMINATION:  GENERAL: A well-built, well-nourished age-appropriate male lying down in the bed, not in distress.  VITAL SIGNS: Temperature 98.7, pulse 87, blood pressure 134/73, respiratory rate of 20, oxygen saturation is 95% on 2 liters of oxygen.  HEENT: Head normocephalic, atraumatic. There is no scleral icterus. Conjunctivae normal. Pupils equal and react to light. Mucous membranes moist. Could not see the oropharynx.  NECK: Supple. No lymphadenopathy. No JVD. No carotid bruit.  CHEST: Has  focal tenderness in the lower part of the sternum where the patient is complaining of chest pain. LUNGS: Bilaterally clear to auscultation.  HEART: S1, S2 regular. No murmurs are heard.  ABDOMEN: Bowel sounds present. Soft, nontender, nondistended.  EXTREMITIES: No pedal edema. Pulses 2+.  NEUROLOGIC: The patient is alert, oriented to place, person, and time. Cranial nerves II through XII intact. Motor 5/5 in upper and lower extremities.   LABORATORY DATA: BMP: BUN  46, creatinine of 2.79. The rest of all the values are within normal limits. CK 351, CK-MB of 2.8, PT 17, INR of 1.4. Chest x-ray, 1 view portable: No acute cardiopulmonary disease. CBC is completely within normal limits. Troponin 0.10.   ASSESSMENT AND PLAN: Parker Robbins is a 64 year old male who comes to the Emergency Department with chest pain.   1. Chest pain. This seems to be more of a musculoskeletal pain. The patient has mild elevation of the troponins; however, normal CK-MB. This could be from the myocardial infarction from last week, especially considering the patient's chronic kidney disease, came to stay longer in the system. The patient is already on Xarelto. We will continue to follow up with cardiac enzymes. If negative, the patient could be discharged home.  2. Chronic kidney disease. The patient is at baseline. The patient is on Naprosyn, which could be further complicating the patient's chronic kidney disease.  3. Morbid obesity: Counseled with the patient regarding diet and exercise. The patient has poor functional status at baseline.  4. Diabetes mellitus. Continue with the home dose of Levemir and Apidra.  5. History of coronary artery disease, status post coronary artery bypass grafting and stent placement. Continue with home medications of Enalapril and metoprolol.  6. Keep the patient on deep vein thrombosis prophylaxis. The patient is already on Xarelto.   TIME SPENT: 50 minutes.    ____________________________ Parker GriffinsPadmaja Theo Reither, MD pv:JT D: 12/14/2013 23:06:39 ET T: 12/14/2013 23:53:16 ET JOB#: [id number]  cc: Parker GriffinsPadmaja Yoltzin Barg, MD, <Dictator> Parker GriffinsPADMAJA Normagene Harvie MD ELECTRONICALLY SIGNED 12/25/2013 21:32

## 2014-08-02 NOTE — Consult Note (Signed)
Chief Complaint:  Subjective/Chief Complaint Significant chest and upper abdominal pain. EKG rhythm seems to suggest ST-T depression. 12 lead EKG is being ordered. SBP only in 90's. Still on pressors.   VITAL SIGNS/ANCILLARY NOTES: **Vital Signs.:   18-Oct-15 06:00  Pulse Pulse 96  Respirations Respirations 20  Pulse Ox % Pulse Ox % 100  Pulse Ox Activity Level  At rest  Oxygen Delivery 2L; Nasal Cannula   Brief Assessment:  GEN no acute distress   Cardiac Regular   Respiratory clear BS   Gastrointestinal tender in upper abdomen   Lab Results:  Routine Chem:  17-Oct-15 04:00   Glucose, Serum  199  BUN  100  Creatinine (comp)  3.66  Sodium, Serum  133  Potassium, Serum  5.3  Chloride, Serum 100  CO2, Serum 21  Calcium (Total), Serum 8.5  Anion Gap 12  Osmolality (calc) 303  eGFR (African American)  22  eGFR (Non-African American)  18 (eGFR values <25m/min/1.73 m2 may be an indication of chronic kidney disease (CKD). Calculated eGFR, using the MRDR Study equation, is useful in  patients with stable renal function. The eGFR calculation will not be reliable in acutely ill patients when serum creatinine is changing rapidly. It is not useful in patients on dialysis. The eGFR calculation may not be applicable to patients at the low and high extremes of body sizes, pregnant women, and vetetarians.)  Result Comment HGB - DUPLICATE ORDER. SEE [id number] Result(s) reported on 25 Jan 2014 at 04:28AM.  Cardiac:  17-Oct-15 04:00   Troponin I  1.40 (0.00-0.05 0.05 ng/mL or less: NEGATIVE  Repeat testing in 3-6 hrs  if clinically indicated. >0.05 ng/mL: POTENTIAL  MYOCARDIAL INJURY. Repeat  testing in 3-6 hrs if  clinically indicated. NOTE: An increase or decrease  of 30% or more on serial  testing suggests a  clinically important change)  CPK-MB, Serum  6.6 (Result(s) reported on 25 Jan 2014 at 04:47AM.)  Routine Hem:  17-Oct-15 04:00   Hemoglobin (CBC) -  WBC (CBC)  9.8  RBC (CBC)  2.86  Hematocrit (CBC)  25.6  Platelet Count (CBC) 293  MCV 89  MCH 29.1  MCHC 32.6  RDW  14.8  Neutrophil % 62.3  Lymphocyte % 26.6  Monocyte % 9.0  Eosinophil % 1.3  Basophil % 0.8  Neutrophil # 6.1  Lymphocyte # 2.6  Monocyte # 0.9  Eosinophil # 0.1  Basophil # 0.1 (Result(s) reported on 25 Jan 2014 at 04:26AM.)   Assessment/Plan:  Assessment/Plan:  Assessment CAD, sleep apnea. Elevated troponin. No active bleeding off blood thinners but patient still on pressors.   Plan Even though patient will likely benefit from EGD, patient is high risk. Will need to see his 12 lead EKG. Need to be off pressors. I don't believe his cardiac status is stable enough to schedule EGD at this time. Will add carafate qid to protonix IV. Will follow. Thanks.   Electronic Signatures: OVerdie Shire(MD)  (Signed 18-Oct-15 10:41)  Authored: Chief Complaint, VITAL SIGNS/ANCILLARY NOTES, Brief Assessment, Lab Results, Assessment/Plan   Last Updated: 18-Oct-15 10:41 by OVerdie Shire(MD)

## 2014-08-02 NOTE — Consult Note (Signed)
Chief Complaint:  Subjective/Chief Complaint Improve chest pain denies shortness of breath blood pressure adequately controlled sitting up in bed eating feels much better today   VITAL SIGNS/ANCILLARY NOTES: **Vital Signs.:   07-Sep-15 11:20  Vital Signs Type Routine  Temperature Temperature (F) 97.5  Celsius 36.3  Temperature Source oral  Pulse Pulse 58  Respirations Respirations 20  Systolic BP Systolic BP 045  Diastolic BP (mmHg) Diastolic BP (mmHg) 77  Mean BP 90  Pulse Ox % Pulse Ox % 96  Pulse Ox Activity Level  At rest  Oxygen Delivery 2L  *Intake and Output.:   07-Sep-15 08:15  Oral Intake      In:  240  Percentage of Meal Eaten  100   Brief Assessment:  GEN well developed, well nourished, no acute distress, obese   Cardiac Regular  murmur present  --Gallop   Respiratory normal resp effort  clear BS   Gastrointestinal Normal   Gastrointestinal details normal Soft  Nontender  Nondistended  No masses palpable   EXTR negative cyanosis/clubbing, negative edema   Lab Results: Routine Chem:  06-Sep-15 04:08   Result Comment TROPONIN - RESULTS VERIFIED BY REPEAT TESTING.  - HIGH PREVIOUSLY CALLED BY RW AT 0045 ON  - 12-15-13/RWW  Result(s) reported on 15 Dec 2013 at 05:24AM.  Result Comment LABS - SPECIMEN SLIGHTLY LIPEMIC  Result(s) reported on 15 Dec 2013 at 04:58AM.  Glucose, Serum  140  BUN  46  Creatinine (comp)  2.84  Sodium, Serum 141  Potassium, Serum 4.3  Chloride, Serum 105  CO2, Serum 26  Calcium (Total), Serum 9.1  Anion Gap 10  Osmolality (calc) 295  eGFR (African American)  26  eGFR (Non-African American)  22 (eGFR values <54m/min/1.73 m2 may be an indication of chronic kidney disease (CKD). Calculated eGFR is useful in patients with stable renal function. The eGFR calculation will not be reliable in acutely ill patients when serum creatinine is changing rapidly. It is not useful in  patients on dialysis. The eGFR calculation may not be  applicable to patients at the low and high extremes of body sizes, pregnant women, and vegetarians.)    07:58   Result Comment TROPONIN - RESULTS VERIFIED BY REPEAT TESTING.  - PREV.CALLED BY S[place]@ 2055 ON 12/14/2013  - CAF  Result(s) reported on 15 Dec 2013 at 03:37PM.  Cardiac:  06-Sep-15 04:08   Troponin I  0.71 (0.00-0.05 0.05 ng/mL or less: NEGATIVE  Repeat testing in 3-6 hrs  if clinically indicated. >0.05 ng/mL: POTENTIAL  MYOCARDIAL INJURY. Repeat  testing in 3-6 hrs if  clinically indicated. NOTE: An increase or decrease  of 30% or more on serial  testing suggests a  clinically important change)  CPK-MB, Serum  4.8 (Result(s) reported on 15 Dec 2013 at 05:18AM.)    07:58   Troponin I  0.56 (0.00-0.05 0.05 ng/mL or less: NEGATIVE  Repeat testing in 3-6 hrs  if clinically indicated. >0.05 ng/mL: POTENTIAL  MYOCARDIAL INJURY. Repeat  testing in 3-6 hrs if  clinically indicated. NOTE: An increase or decrease  of 30% or more on serial  testing suggests a  clinically important change)  CPK-MB, Serum  5.0 (Result(s) reported on 15 Dec 2013 at 08:43AM.)  Routine Hem:  06-Sep-15 04:08   WBC (CBC) 5.9  RBC (CBC)  4.02  Hemoglobin (CBC)  12.3  Hematocrit (CBC)  35.9  Platelet Count (CBC) 240  MCV 89  MCH 30.6  MCHC 34.3  RDW  14.8  Neutrophil % 45.5  Lymphocyte % 36.5  Monocyte % 10.4  Eosinophil % 6.4  Basophil % 1.2  Neutrophil # 2.7  Lymphocyte # 2.1  Monocyte # 0.6  Eosinophil # 0.4  Basophil # 0.1 (Result(s) reported on 15 Dec 2013 at 04:58AM.)   Assessment/Plan:  Assessment/Plan:  Assessment IMP  unstable angina  chest pain  coronary disease  hypertension  obesity  acute on chronic renal for  renal insufficiency  diabetes  hyperlipidemia  paroxysmal atrial fibrillation  elevated troponin .   Plan PLAN  recommend cardiac catheterization  in a.m.  renal protection before catheterization  consult Nephrology prior to catheterization   IV  hydration  continue diabetes management  continue and hypertension therapy  weight loss exercise portion control  continue statin therapy   Electronic Signatures: Yolonda Kida (MD)  (Signed 07-Sep-15 13:13)  Authored: Chief Complaint, VITAL SIGNS/ANCILLARY NOTES, Brief Assessment, Lab Results, Assessment/Plan   Last Updated: 07-Sep-15 13:13 by Yolonda Kida (MD)

## 2014-08-02 NOTE — Consult Note (Signed)
   Comments   I met with pt's wife. She understands that pt has multiple medical problems including his inoperable CAD and that this is making his work-up of GI bleed difficult. Wife says that she and her husband have discussed end-of-life many times and pt has told wife that he is ready to go when it is his time. She says they have not specifically discussed code status and would like for me to speak with pt about that.  is currently sleeping. Will likely speak with him tomorrow when I am able to have discussion with him.   Electronic Signatures: Magnum Lunde, Izora Gala (MD)  (Signed 19-Oct-15 16:21)  Authored: Palliative Care   Last Updated: 19-Oct-15 16:21 by Kilani Joffe, Izora Gala (MD)

## 2014-08-02 NOTE — Consult Note (Signed)
Chief Complaint:  Subjective/Chief Complaint Still with chest and abdominal pain. Off pressors now. EKG from yesterday showed lateral ischemic changes.   VITAL SIGNS/ANCILLARY NOTES: **Vital Signs.:   19-Oct-15 07:00  Temperature Temperature (F) 98.4  Celsius 36.8  Temperature Source oral  Pulse Pulse 72  Respirations Respirations 20  Systolic BP Systolic BP 211  Diastolic BP (mmHg) Diastolic BP (mmHg) 55  Mean BP 76  Pulse Ox % Pulse Ox % 100  Oxygen Delivery Room Air/ 21 %   Brief Assessment:  GEN no acute distress   Cardiac Regular  tenderness to palpation in chest   Respiratory clear BS   Gastrointestinal epigastric tenderness   Lab Results: Routine Chem:  18-Oct-15 14:30   Result Comment LABS - This specimen was collected through an   - indwelling catheter or arterial line.  - A minimum of 63mls of blood was wasted prior    - to collecting the sample.  Interpret  - results with caution. TROPONIN - RESULTS VERIFIED BY REPEAT TESTING.  - PREV.CALLED BY CAF @ 9417 ON 01/26/2014  - CAF  Result(s) reported on 26 Jan 2014 at 03:23PM.    18:30   Result Comment LABS - This specimen was collected through an   - indwelling catheter or arterial line.  - A minimum of 54mls of blood was wasted prior    - to collecting the sample.  Interpret  - results with caution.  Result(s) reported on 26 Jan 2014 at 06:54PM.  19-Oct-15 04:18   Result Comment LABS - This specimen was collected through an   - indwelling catheter or arterial line.  - A minimum of 22mls of blood was wasted prior    - to collecting the sample.  Interpret  - results with caution.  Result(s) reported on 27 Jan 2014 at 04:36AM.  Glucose, Serum  211  BUN  86  Creatinine (comp)  3.15  Sodium, Serum 136  Potassium, Serum  5.9  Chloride, Serum 106  CO2, Serum  20  Calcium (Total), Serum  7.9  Anion Gap 10  Osmolality (calc) 304  eGFR (African American)  26  eGFR (Non-African American)  21 (eGFR values  <77mL/min/1.73 m2 may be an indication of chronic kidney disease (CKD). Calculated eGFR, using the MRDR Study equation, is useful in  patients with stable renal function. The eGFR calculation will not be reliable in acutely ill patients when serum creatinine is changing rapidly. It is not useful in patients on dialysis. The eGFR calculation may not be applicable to patients at the low and high extremes of body sizes, pregnant women, and vetetarians.)  Cardiac:  18-Oct-15 14:30   Troponin I  0.81 (0.00-0.05 0.05 ng/mL or less: NEGATIVE  Repeat testing in 3-6 hrs  if clinically indicated. >0.05 ng/mL: POTENTIAL  MYOCARDIAL INJURY. Repeat  testing in 3-6 hrs if  clinically indicated. NOTE: An increase or decrease  of 30% or more on serial  testing suggests a  clinically important change)    18:30   CPK-MB, Serum 3.2  Routine Hem:  19-Oct-15 04:18   WBC (CBC)  12.3  RBC (CBC)  2.65  Hemoglobin (CBC)  7.7  Hematocrit (CBC)  24.0  Platelet Count (CBC) 285  MCV 91  MCH 29.1  MCHC 32.1  RDW  15.1  Neutrophil % 72.3  Lymphocyte % 16.2  Monocyte % 10.3  Eosinophil % 0.5  Basophil % 0.7  Neutrophil #  8.9  Lymphocyte # 2.0  Monocyte #  1.3  Eosinophil # 0.1  Basophil # 0.1   Assessment/Plan:  Assessment/Plan:  Assessment UGI bleeding. No active bleeding, but hgb still less than 8 after blood transfuion. Troponin level coming down but still elevated. Some of the chest pain could be musculosketal since patient has tenderness to palpation in chest area.   Plan Continue protonix and carafate. With EKG changes, elevated troponin, I cannot proceed with EGD at this time. Try keep hgb above 8 if possible. I will be out tomorrow. Will check back on Wed. Thanks.   Electronic Signatures: Verdie Shire (MD)  (Signed 19-Oct-15 08:43)  Authored: Chief Complaint, VITAL SIGNS/ANCILLARY NOTES, Brief Assessment, Lab Results, Assessment/Plan   Last Updated: 19-Oct-15 08:43 by Verdie Shire (MD)

## 2014-08-02 NOTE — Consult Note (Signed)
EGD completely normal. Not clear where he bled from. No real explanation for his abdominal pain. Pt cannot tolerate a colonoscopy. Would continue protonix and carafate now. Consider adding bentyl if abdominal pain persists. Moniter hgb. thanks  Electronic Signatures: Lutricia Feilh, Leniyah Martell (MD)  (Signed on 21-Oct-15 14:00)  Authored  Last Updated: 21-Oct-15 14:00 by Lutricia Feilh, Edelmira Gallogly (MD)

## 2014-08-02 NOTE — Op Note (Signed)
PATIENT NAME:  Parker Robbins, Cosimo L MR#:  [id number] DATE OF BIRTH:  Jul 01, 1950  DATE OF PROCEDURE:  08/21/2013  PREOPERATIVE DIAGNOSES: 1.  Right thigh seroma status post previous saphenous vein harvest.  2.  Coronary artery disease.  3.  Obesity.   POSTOPERATIVE DIAGNOSES: 1.  Right thigh seroma status post previous saphenous vein harvest.  2.  Coronary artery disease.  3.  Obesity.   PROCEDURE PERFORMED:  Excision of right thigh seroma.  SURGEON:  Annice NeedyJason S. Basheer Molchan, M.D.   ANESTHESIA:  Local with MAC.   ESTIMATED BLOOD LOSS:  Minimal.   INDICATION FOR PROCEDURE:  This is a gentleman who came to our office with complaints of an enlarging mass in his right thigh. This was locally painful and he desired to have it removed. A noninvasive study showed just a fluid collection in this area consistent with a seroma, and he had had his saphenous vein harvested previously for coronary bypass in the past. He understood the risks and benefits of resection and the fact that it may recur and desired to have this resected.   DESCRIPTION OF PROCEDURE:  The patient is brought to the operative suite and after an adequate level of intravenous sedation his right thigh was sterilely prepped and draped and a sterile surgical field was created. His previous small incision for his endoscopic saphenous vein harvest was excised in an elliptical fashion. We had dissected down deep to the fluid collection. A small amount of fluid was actually returned and the remnants of what appeared to be the seroma sac were dissected out and excised and sent to pathology. The wound was then irrigated. Hemostasis was achieved. Several 3-0 Vicryls were placed in the deep space to close off the deep space. The superficial space was closed with a running 3-0 Vicryl and the skin was closed with 4-0 Monocryl. Dermabond was placed as a dressing. The patient tolerated the procedure well and was taken to the recovery room in stable condition.      ____________________________ Annice NeedyJason S. Emika Tiano, MD jsd:dmm D: 08/21/2013 11:04:07 ET T: 08/21/2013 11:34:32 ET JOB#: [id number]  cc: Annice NeedyJason S. Alphonsus Doyel, MD, <Dictator> Serita ShellerErnest B. Maryellen PileEason, MD Annice NeedyJASON S Roosevelt Eimers MD ELECTRONICALLY SIGNED 08/22/2013 15:33

## 2014-08-02 NOTE — Consult Note (Signed)
PATIENT NAME:  Parker Robbins, [name] MR#:  [id number] DATE OF BIRTH:  [birth year]  DATE OF CONSULTATION:  01/25/2014  CONSULTING PHYSICIAN:  Ezzard StandingPaul Y. Brezlyn Manrique, MD  REASON FOR REFERRAL: Melena and drop in hemoglobin.   HISTORY OF PRESENT ILLNESS: The patient is a [age] year old African American male with multiple medical history including coronary artery disease and has had coronary artery bypass surgery as well as stent placement who was admitted last month with myocardial infarction. At that time, the patient had a cardiac catheterization that revealed multiple acute blockages. Unfortunately, due to his anatomy and circumstances, cardiology recommended medical therapy only. He also has chronic renal disease with baseline creatinine of 2.7. He also has sleep apnea, diabetes, atrial fibrillation, on Xarelto. He presented to the hospital with 2 day history of melena. His baseline hemoglobin is 11. When he was discharged his hemoglobin was 10. Today, when he was admitted, his hemoglobin was down to 8. He was tachycardic. Blood pressure was initially okay but then it dropped requiring him to be on pressors. The patient has had some mild abdominal discomfort, but no nausea or vomiting. He has had some dark stools for the last couple of days. He does have a history of reflux disease. The patient denied having any prior ulcer disease. Ever since he had the MI, he also has been on Plavix and Xarelto as well as aspirin. He also takes naproxen p.r.n. for his chronic pain.   PAST MEDICAL HISTORY: Notable for above. He does have significant coronary artery disease. He also has sleep apnea, but does not use CPAP. He also has gout.   PAST SURGICAL HISTORY: Include knee surgery, back surgery and bypass surgery.   ALLERGIES: HE IS ALLERGIC TO PENICILLIN.   MEDICATIONS AT HOME: Insulin, baby aspirin, Flexeril, enalapril twice a day, gabapentin 3 times a day, Imdur daily, magnesium oxide, metoprolol 100 mg twice a day, naproxen twice a  day, omeprazole 20 mg daily, simvastatin at bedtime, torsemide 100 mg daily and Xarelto 20 mg daily.   SOCIAL HISTORY: He lives at home. He quit smoking and alcohol more than 15 years ago.   FAMILY HISTORY: Notable for heart disease.   REVIEW OF SYSTEMS: Please refer to the one that Dr. Tresa EndoKelly dictated, no changes.   PHYSICAL EXAMINATION:  GENERAL: The patient appears to be in no acute distress.  VITAL SIGNS: He is afebrile. His vital signs are stable at this time. However, he is on norepinephrine to maintain his blood pressure.  HEENT: Normocephalic, atraumatic head. Pupils are equally reactive.  LUNGS: Decreased breath sounds. There is no wheezing. No crackles. CARDIOVASCULAR: He has some irregularly irregular heart rate. No murmurs audible.  ABDOMEN: Normoactive bowel sounds, soft. I thought there was some tenderness in the mid to upper abdomen. He had active bowel sounds. There is no hepatosplenomegaly.  EXTREMITIES: Show 1+ pitting edema.  NEUROLOGIC: Examination is nonfocal.  SKIN: Otherwise okay.   LABORATORY DATA: On the morning of October 17, creatinine was 3.66, potassium is 5.3, glucose 199. Liver enzymes are normal. His last troponin level is elevated at 1.30, initial hemoglobin was 8.2 then it went down to 7.7 it was 8.3 this morning after blood transfusion. INR was 1.9.   IMPRESSION AND RECOMMENDATIONS: This is a patient with known heart disease who is on multiple blood thinners including Plavix and Xarelto. He also takes aspirin and naproxen which all contribute to bleeding. These are being stopped. The patient was placed on Protonix IV for upper GI bleeding.  I am concerned that he has multiple medical history including heart condition. His troponin level is elevated. He is also on pressors. Before we can consider any upper endoscopy, will need cardiac clearance. The patient also needs to be off the pressors before we can do anything. He is at a relatively high risk for  complications for sedation because of his heart condition and sleep apnea.   Thank you for the referral.     ____________________________ Ezzard Standing. Bluford Kaufmann, MD pyo:JT D: 01/26/2014 09:09:24 ET T: 01/26/2014 09:52:00 ET JOB#: [id number]  cc: Ezzard Standing. Bluford Kaufmann, MD, <Dictator> Unknown doctor  Ezzard Standing [facility] MD ELECTRONICALLY SIGNED 01/27/2014 9:38

## 2014-08-02 NOTE — Discharge Summary (Signed)
Dates of Admission and Diagnosis:  Date of Admission 04-Dec-2013   Date of Discharge 06-Dec-2013   Admitting Diagnosis Chest pain   Final Diagnosis sub endocardial MI- suggested medical management by Cardiology. A flutter CRF Htn DM Obasity hyperlipidemia    Chief Complaint/History of Present Illness A very pleasant 64 year old male with a history of coronary artery disease, status post CABG and stents, hypertension, who presented with the above complaint. The patient says that he has been in his usual state of health without any problems or chest pain; however, today as he was leaving the store he developed substernal chest pain radiating to his left arm all the way to his fingers, it radiated at a 6/10. Has lasted approximately 1 hour, relieved with nitroglycerin here in the ER. His initial troponin was negative, however, a repeat troponin is slightly elevated at 0.07. The patient is chest pain free currently. He did say that with his chest pain he also had shortness of breath and diaphoresis.   Allergies:  Penicillin: N/V, Hives  Hepatic:  26-Aug-15 11:29   Bilirubin, Total 0.5  Alkaline Phosphatase 65 (46-116 NOTE: New Reference Range 10/29/13)  SGPT (ALT) 18 (14-63 NOTE: New Reference Range 10/29/13)  SGOT (AST) 24  Total Protein, Serum 6.9  Albumin, Serum  3.3  Routine Chem:  26-Aug-15 11:29   Glucose, Serum  198  BUN  49  Creatinine (comp)  2.55  Sodium, Serum 141  Potassium, Serum 4.1  Chloride, Serum 107  CO2, Serum 24  Calcium (Total), Serum 8.8  Anion Gap 10  Osmolality (calc) 300  eGFR (African American)  29  eGFR (Non-African American)  25 (eGFR values <34mL/min/1.73 m2 may be an indication of chronic kidney disease (CKD). Calculated eGFR is useful in patients with stable renal function. The eGFR calculation will not be reliable in acutely ill patients when serum creatinine is changing rapidly. It is not useful in  patients on dialysis. The eGFR  calculation may not be applicable to patients at the low and high extremes of body sizes, pregnant women, and vegetarians.)  27-Aug-15 01:50   Glucose, Serum  171  BUN  52  Creatinine (comp)  2.76  Sodium, Serum 142  Potassium, Serum 4.0  Chloride, Serum 106  CO2, Serum 25  Calcium (Total), Serum 9.1  Anion Gap 11  Osmolality (calc) 301  eGFR (African American)  27  eGFR (Non-African American)  23 (eGFR values <72mL/min/1.73 m2 may be an indication of chronic kidney disease (CKD). Calculated eGFR is useful in patients with stable renal function. The eGFR calculation will not be reliable in acutely ill patients when serum creatinine is changing rapidly. It is not useful in  patients on dialysis. The eGFR calculation may not be applicable to patients at the low and high extremes of body sizes, pregnant women, and vegetarians.)  Cholesterol, Serum 174  Triglycerides, Serum  214  HDL (INHOUSE)  28  VLDL Cholesterol Calculated  43  LDL Cholesterol Calculated  103 (Result(s) reported on 05 Dec 2013 at 02:15AM.)  28-Aug-15 05:34   Glucose, Serum  140  BUN  55  Creatinine (comp)  2.78  Sodium, Serum  135  Potassium, Serum 4.0  Chloride, Serum 101  CO2, Serum 25  Calcium (Total), Serum 9.4  Anion Gap 9  Osmolality (calc) 288  eGFR (African American)  26  eGFR (Non-African American)  23 (eGFR values <43mL/min/1.73 m2 may be an indication of chronic kidney disease (CKD). Calculated eGFR is useful in patients with stable  renal function. The eGFR calculation will not be reliable in acutely ill patients when serum creatinine is changing rapidly. It is not useful in  patients on dialysis. The eGFR calculation may not be applicable to patients at the low and high extremes of body sizes, pregnant women, and vegetarians.)  Cardiac:  26-Aug-15 11:29   Troponin I < 0.02 (0.00-0.05 0.05 ng/mL or less: NEGATIVE  Repeat testing in 3-6 hrs  if clinically indicated. >0.05 ng/mL:  POTENTIAL  MYOCARDIAL INJURY. Repeat  testing in 3-6 hrs if  clinically indicated. NOTE: An increase or decrease  of 30% or more on serial  testing suggests a  clinically important change)  CK, Total 269 (39-308 NOTE: NEW REFERENCE RANGE  05/13/2013)  CPK-MB, Serum 2.9 (Result(s) reported on 04 Dec 2013 at 11:57AM.)    15:20   Troponin I  0.07 (0.00-0.05 0.05 ng/mL or less: NEGATIVE  Repeat testing in 3-6 hrs  if clinically indicated. >0.05 ng/mL: POTENTIAL  MYOCARDIAL INJURY. Repeat  testing in 3-6 hrs if  clinically indicated. NOTE: An increase or decrease  of 30% or more on serial  testing suggests a  clinically important change)    19:09   Troponin I  0.13 (0.00-0.05 0.05 ng/mL or less: NEGATIVE  Repeat testing in 3-6 hrs  if clinically indicated. >0.05 ng/mL: POTENTIAL  MYOCARDIAL INJURY. Repeat  testing in 3-6 hrs if  clinically indicated. NOTE: An increase or decrease  of 30% or more on serial  testing suggests a  clinically important change)  Routine Hem:  26-Aug-15 11:29   WBC (CBC) 4.5  RBC (CBC)  4.05  Hemoglobin (CBC)  11.9  Hematocrit (CBC)  36.9  Platelet Count (CBC) 237 (Result(s) reported on 04 Dec 2013 at 11:53AM.)  MCV 91  MCH 29.5  MCHC 32.4  RDW  15.3  27-Aug-15 01:50   WBC (CBC) 4.9  RBC (CBC)  3.90  Hemoglobin (CBC)  11.4  Hematocrit (CBC)  35.4  Platelet Count (CBC) 218  MCV 91  MCH 29.2  MCHC 32.1  RDW  14.9  Neutrophil % 41.2  Lymphocyte % 38.7  Monocyte % 14.4  Eosinophil % 5.4  Basophil % 0.3  Neutrophil # 2.0  Lymphocyte # 1.9  Monocyte # 0.7  Eosinophil # 0.3  Basophil # 0.0 (Result(s) reported on 05 Dec 2013 at 02:15AM.)  28-Aug-15 05:34   WBC (CBC) 6.5  RBC (CBC)  4.15  Hemoglobin (CBC)  12.4  Hematocrit (CBC)  37.4  Platelet Count (CBC) 247  MCV 90  MCH 29.9  MCHC 33.1  RDW  15.2  Neutrophil % 39.4  Lymphocyte % 39.9  Monocyte % 15.4  Eosinophil % 4.3  Basophil % 1.0  Neutrophil # 2.5  Lymphocyte #  2.6  Monocyte # 1.0  Eosinophil # 0.3  Basophil # 0.1 (Result(s) reported on 06 Dec 2013 at 06:16AM.)   Pertinent Past History:  Pertinent Past History 1.  CAD, status post 3-vessel CABG with stents.  2.  Hypertension. 3.  Obesity.  4.  Former history of OSA but apparently this resolved after a uvuloplasty.  5.  Gout.  6.  GERD.  7.  Diabetes.  8.  Hyperlipidemia. 9.  Chronic kidney disease stage III.   Hospital Course:  Hospital Course 64 yr male with hx of coronary artery disease/coronary artery bypass graft, hypertension, chronic kidney disease, who presents with non-ST-segment elevation myocardial infarction.  1.  Non-ST-segment elevation myocardial infarction. as per cardiology- subendocardial MI. The patient's initial troponin  was negative. however, repeat troponin is elevated at 0.07> 0.1.  He had substernal chest pain radiating to his left arm associated with shortness of breath and diaphoresis  was on a heparin drip and continued on aspirin, statin, and Imdur along with metoprolol.   appreciated cardiology consult- due to mild elevation in trop + renal failure- may keep conservative approach.   Stopped heparin drip after 24 hrs.   Cont asa+ xarelto.   spoke to Dr. Nehemiah Massed- discharge with follow up in cardiology clinic.  * A flutter- on Xarelto and rate control as out pt- continue same. *  Hypertension. continue his outpatient medications including metoprolol and Imdur.  *  Obesity. Encourage weight loss as tolerated. The patient does say that he has lost some weight.  *  Hyperlipidemia. Continue Zocor.    * Diabetes. We will continue his outpatient medications and sliding scale insulin.  * pain in abd- with each movement- mild- likely muscular.    WBCs and LFT normal- no nausea / diarrhea.   given muscle relaxants. stble.   Condition on Discharge Stable   DISCHARGE INSTRUCTIONS HOME MEDS:  Medication Reconciliation: Patient's Home Medications at Discharge:      Medication Instructions  magnesium oxide 400 mg oral tablet  1 tab(s) orally once a day   enalapril 10 mg oral tablet  1 tab(s) orally 2 times a day   isosorbide mononitrate 30 mg oral tablet, extended release  1 tab(s) orally once a day   gabapentin 300 mg oral capsule  1 cap(s) orally 3 times a day   apidra 100 units/ml subcutaneous solution  20 unit(s) subcutaneous 3 times a day   levemir 100 units/ml subcutaneous solution  45 unit(s) subcutaneous once a day (at bedtime)   aspirin 81 mg oral tablet  1 tab(s) orally once a day   simvastatin 40 mg oral tablet  1 tab(s) orally once a day (at bedtime)   metoprolol tartrate 100 mg oral tablet  1 tab(s) orally 2 times a day   omeprazole 20 mg oral delayed release capsule  1 cap(s) orally once a day   naproxen 500 mg oral tablet  1 tab(s) orally 2 times a day   torsemide 100 mg oral tablet  1 tab(s) orally once a day   cyclobenzaprine 10 mg oral tablet  1 tab(s) orally every 8 hours, As Needed, muscle spasm , As needed, muscle spasm   xarelto 20 mg oral tablet  1 tab(s) orally once a day (in the evening)    STOP TAKING THE FOLLOWING MEDICATION(S):    furosemide 20 mg oral tablet: 1 tab(s) orally 2 times a day  Physician's Instructions:  Diet Low Sodium  Low Fat, Low Cholesterol  Carbohydrate Controlled (ADA) Diet   Activity Limitations As tolerated   Return to Work Not Applicable   Time frame for Follow Up Appointment 1-2 weeks  Dr. Clayborn Bigness.   Other Comments Please make appointment next week with Dr. Clayborn Bigness.   Electronic Signatures: Vaughan Basta (MD)  (Signed 31-Aug-15 16:26)  Authored: ADMISSION DATE AND DIAGNOSIS, CHIEF COMPLAINT/HPI, Allergies, PERTINENT LABS, PERTINENT PAST HISTORY, HOSPITAL COURSE, DISCHARGE INSTRUCTIONS HOME MEDS, PATIENT INSTRUCTIONS   Last Updated: 31-Aug-15 16:26 by Vaughan Basta (MD)

## 2014-08-02 NOTE — Consult Note (Signed)
   Comments   Pt is sleeping, still sedated from procedure. Wife at bedside. Wife is aware that EGD was normal and no bleeding source identified. She also understands that pt cannot undergo a colonoscopy. Plan is for pt to return home at d/c with home health. I talked with wife about home hospice instead of home health and she is in agreement with this. CM notified.  once again discussed code status with wife. She feels that pt wants to be a DNR but asks that I confirm with him. I will follow up in AM when pt is able to participate in discussion.  Dx: CAD      Secondary Dx: CKD III, acute blood loss anemia  Electronic Signatures: Labrittany Wechter, Harriett SineNancy (MD)  (Signed 21-Oct-15 18:17)  Authored: Palliative Care   Last Updated: 21-Oct-15 18:17 by Jeromie Gainor, Harriett SineNancy (MD)

## 2014-08-02 NOTE — Consult Note (Signed)
No chest pain today. Still with abdominal pain after eating. No active bleeding. Hgb at 8. Discussed case with anesthesia. Pt at high risk for EGD. Pt is very well aware. Will proceed.  Will try to minimize sedation and time to do the EGD to lessen any stress to his heart.   Electronic Signatures: Lutricia Feilh, Aireona Torelli (MD)  (Signed on 21-Oct-15 13:40)  Authored  Last Updated: 21-Oct-15 13:40 by Lutricia Feilh, Perlie Scheuring (MD)

## 2014-08-02 NOTE — Discharge Summary (Signed)
Dates of Admission and Diagnosis:  Date of Admission 14-Dec-2013   Date of Discharge 18-Dec-2013   Admitting Diagnosis Acute Non ST elevation MI   Final Diagnosis Acute NSTEMI- found RCA blockage, suggested medical management by Cardiology. Atreal flutter Chronic renal failure Hypertension Diabetes mellitus Obasity hyperlipidemia    Chief Complaint/History of Present Illness a 64 year old morbidly obese male with history of coronary artery disease status post coronary artery bypass grafting, had a recent admission end of August 2015 for chest pain. The patient was found to have mild elevation of the troponins of 0.17. The patient was treated with heparin. The patient had elevated creatinine of 2.7. Concerning this, the decision was made to treat him medically. The patient was recommended to follow up with Dr. Clayborn Bigness as an outpatient. The patient states however that this afternoon, the patient was driving and around 4 p.m., started to experience pain in the lower part of the sternum. No radiation tightness in the chest similar to the pain that he experienced during the previous admission. Felt slightly lightheaded, mildly nauseated. Concerning this, came to the Emergency Department. Work-up in the Emergency Department, the patient is found to have troponin of 0.1; however, has a normal CK-MB of 2.8. The patient currently denies having any chest pain.   Allergies:  Penicillin: N/V, Hives  Cardiology:  05-Sep-15 19:28   Ventricular Rate 100  Atrial Rate 258  QRS Duration 96  QT 362  QTc 466  T Axis 99  ECG interpretation Atrial flutter with variable A-V block with premature ventricular or aberrantly conducted complexes T wave abnormality, consider lateral ischemia Prolonged QT Abnormal ECG When compared with ECG of 04-Dec-2013 11:06, Nonspecific T wave abnormality, improved in Inferior leads Nonspecific T wave abnormality no longer evident in Anterior  leads ----------unconfirmed---------- Confirmed by OVERREAD, NOT ([id number]), editor PEARSON, BARBARA ([id number]) on 12/17/2013 9:13:44 AM  Routine Chem:  05-Sep-15 19:28   Glucose, Serum  192  BUN  46  Creatinine (comp)  2.79  Sodium, Serum 137  Potassium, Serum 4.4  Chloride, Serum 103  CO2, Serum 23  Calcium (Total), Serum 9.5  Anion Gap 11  Osmolality (calc) 291  eGFR (African American)  26  eGFR (Non-African American)  23 (eGFR values <37mL/min/1.73 m2 may be an indication of chronic kidney disease (CKD). Calculated eGFR is useful in patients with stable renal function. The eGFR calculation will not be reliable in acutely ill patients when serum creatinine is changing rapidly. It is not useful in  patients on dialysis. The eGFR calculation may not be applicable to patients at the low and high extremes of body sizes, pregnant women, and vegetarians.)  Result Comment CK - Slight hemolysis, interpret results with  - caution.  Result(s) reported on 14 Dec 2013 at 10:23PM.  06-Sep-15 04:08   Glucose, Serum  140  BUN  46  Creatinine (comp)  2.84  Sodium, Serum 141  Potassium, Serum 4.3  Chloride, Serum 105  CO2, Serum 26  Calcium (Total), Serum 9.1  Anion Gap 10  Osmolality (calc) 295  eGFR (African American)  26  eGFR (Non-African American)  22 (eGFR values <49mL/min/1.73 m2 may be an indication of chronic kidney disease (CKD). Calculated eGFR is useful in patients with stable renal function. The eGFR calculation will not be reliable in acutely ill patients when serum creatinine is changing rapidly. It is not useful in  patients on dialysis. The eGFR calculation may not be applicable to patients at the low and high extremes of body sizes,  pregnant women, and vegetarians.)  Result Comment TROPONIN - RESULTS VERIFIED BY REPEAT TESTING.  - HIGH PREVIOUSLY CALLED BY RW AT 0045 ON  - 12-15-13/RWW  Result(s) reported on 15 Dec 2013 at 05:24AM.  08-Sep-15 04:04   Glucose, Serum  154  BUN   40  Creatinine (comp)  2.38  Sodium, Serum 139  Potassium, Serum 3.8  Chloride, Serum 102  CO2, Serum 27  Calcium (Total), Serum 9.1  Anion Gap 10  Osmolality (calc) 290  eGFR (African American)  32  eGFR (Non-African American)  28 (eGFR values <77mL/min/1.73 m2 may be an indication of chronic kidney disease (CKD). Calculated eGFR is useful in patients with stable renal function. The eGFR calculation will not be reliable in acutely ill patients when serum creatinine is changing rapidly. It is not useful in  patients on dialysis. The eGFR calculation may not be applicable to patients at the low and high extremes of body sizes, pregnant women, and vegetarians.)  09-Sep-15 06:18   Glucose, Serum  114  BUN  36  Creatinine (comp)  2.28  Sodium, Serum 138  Potassium, Serum 3.7  Chloride, Serum 100  CO2, Serum 32  Calcium (Total), Serum 9.0  Anion Gap  6  Osmolality (calc) 285  eGFR (African American)  34  eGFR (Non-African American)  29 (eGFR values <25mL/min/1.73 m2 may be an indication of chronic kidney disease (CKD). Calculated eGFR is useful in patients with stable renal function. The eGFR calculation will not be reliable in acutely ill patients when serum creatinine is changing rapidly. It is not useful in  patients on dialysis. The eGFR calculation may not be applicable to patients at the low and high extremes of body sizes, pregnant women, and vegetarians.)  Result Comment ANTI-XA - CANCEL ORDER; SPECIMEN CLOTTED.Marland KitchenMarland KitchenCambria  - LAB TO RECOLLECT AND REORDER  Result(s) reported on 18 Dec 2013 at 06:59AM.  Cardiac:  05-Sep-15 19:28   Troponin I  0.10 (0.00-0.05 0.05 ng/mL or less: NEGATIVE  Repeat testing in 3-6 hrs  if clinically indicated. >0.05 ng/mL: POTENTIAL  MYOCARDIAL INJURY. Repeat  testing in 3-6 hrs if  clinically indicated. NOTE: An increase or decrease  of 30% or more on serial  testing suggests a  clinically important change)  CPK-MB, Serum 2.8  CK,  Total  351 (39-308 NOTE: NEW REFERENCE RANGE  05/13/2013)    23:55   Troponin I  0.67 (0.00-0.05 0.05 ng/mL or less: NEGATIVE  Repeat testing in 3-6 hrs  if clinically indicated. >0.05 ng/mL: POTENTIAL  MYOCARDIAL INJURY. Repeat  testing in 3-6 hrs if  clinically indicated. NOTE: An increase or decrease  of 30% or more on serial  testing suggests a  clinically important change)  06-Sep-15 04:08   Troponin I  0.71 (0.00-0.05 0.05 ng/mL or less: NEGATIVE  Repeat testing in 3-6 hrs  if clinically indicated. >0.05 ng/mL: POTENTIAL  MYOCARDIAL INJURY. Repeat  testing in 3-6 hrs if  clinically indicated. NOTE: An increase or decrease  of 30% or more on serial  testing suggests a  clinically important change)  CPK-MB, Serum  4.8 (Result(s) reported on 15 Dec 2013 at 05:18AM.)    07:58   Troponin I  0.56 (0.00-0.05 0.05 ng/mL or less: NEGATIVE  Repeat testing in 3-6 hrs  if clinically indicated. >0.05 ng/mL: POTENTIAL  MYOCARDIAL INJURY. Repeat  testing in 3-6 hrs if  clinically indicated. NOTE: An increase or decrease  of 30% or more on serial  testing suggests a  clinically important  change)  Routine Coag:  05-Sep-15 19:28   Activated PTT (APTT) 32.3 (A HCT value >55% may artifactually increase the APTT. In one study, the increase was an average of 19%. Reference: "Effect on Routine and Special Coagulation Testing Values of Citrate Anticoagulant Adjustment in Patients with High HCT Values." American Journal of Clinical Pathology 2006;126:400-405.)  Prothrombin  17.1  INR 1.4 (INR reference interval applies to patients on anticoagulant therapy. A single INR therapeutic range for coumarins is not optimal for all indications; however, the suggested range for most indications is 2.0 - 3.0. Exceptions to the INR Reference Range may include: Prosthetic heart valves, acute myocardial infarction, prevention of myocardial infarction, and combinations of aspirin and  anticoagulant. The need for a higher or lower target INR must be assessed individually. Reference: The Pharmacology and Management of the Vitamin K  antagonists: the seventh ACCP Conference on Antithrombotic and Thrombolytic Therapy. Chest.2004 Sept:126 (3suppl): L7870634. A HCT value >55% may artifactually increase the PT.  In one study,  the increase was an average of 25%. Reference:  "Effect on Routine and Special Coagulation Testing Values of Citrate Anticoagulant Adjustment in Patients with High HCT Values." American Journal of Clinical Pathology 2006;126:400-405.)  Routine Hem:  05-Sep-15 19:28   WBC (CBC) 7.6  RBC (CBC)  4.17  Hemoglobin (CBC)  12.7  Hematocrit (CBC)  37.8  Platelet Count (CBC) 242 (Result(s) reported on 14 Dec 2013 at 08:36PM.)  MCV 91  MCH 30.5  MCHC 33.7  RDW  15.4  06-Sep-15 04:08   WBC (CBC) 5.9  RBC (CBC)  4.02  Hemoglobin (CBC)  12.3  Hematocrit (CBC)  35.9  Platelet Count (CBC) 240  MCV 89  MCH 30.6  MCHC 34.3  RDW  14.8  Neutrophil % 45.5  Lymphocyte % 36.5  Monocyte % 10.4  Eosinophil % 6.4  Basophil % 1.2  Neutrophil # 2.7  Lymphocyte # 2.1  Monocyte # 0.6  Eosinophil # 0.4  Basophil # 0.1 (Result(s) reported on 15 Dec 2013 at 04:58AM.)  08-Sep-15 04:04   WBC (CBC) 6.2  RBC (CBC)  3.94  Hemoglobin (CBC)  11.5  Hematocrit (CBC)  35.5  Platelet Count (CBC) 226  MCV 90  MCH 29.1  MCHC 32.4  RDW  15.2  Neutrophil % 35.7  Lymphocyte % 46.6  Monocyte % 10.3  Eosinophil % 6.4  Basophil % 1.0  Neutrophil # 2.2  Lymphocyte # 2.9  Monocyte # 0.6  Eosinophil # 0.4  Basophil # 0.1 (Result(s) reported on 17 Dec 2013 at 05:22AM.)  09-Sep-15 06:18   WBC (CBC) 5.7  RBC (CBC)  3.86  Hemoglobin (CBC)  11.4  Hematocrit (CBC)  34.4  Platelet Count (CBC) 219  MCV 89  MCH 29.5  MCHC 33.2  RDW  15.2  Neutrophil % 48.8  Lymphocyte % 33.8  Monocyte % 10.3  Eosinophil % 5.9  Basophil % 1.2  Neutrophil # 2.8  Lymphocyte # 1.9   Monocyte # 0.6  Eosinophil # 0.3  Basophil # 0.1 (Result(s) reported on 18 Dec 2013 at 06:40AM.)   PERTINENT RADIOLOGY STUDIES: XRay:    05-Sep-15 20:25, Chest Portable Single View  Chest Portable Single View   REASON FOR EXAM:    chest pain  COMMENTS:       PROCEDURE: DXR - DXR PORTABLE CHEST SINGLE VIEW  - Dec 14 2013  8:25PM     CLINICAL DATA:  Chest pain.    EXAM:  PORTABLE CHEST - 1 VIEW  COMPARISON:  12/04/2013    FINDINGS:  Postoperative changes inthe mediastinum. Cardiac enlargement  without vascular congestion. Soft tissue attenuation over the lung  bases. No definitive infiltration or atelectasis. No pneumothorax.  No blunting of costophrenic angles.     IMPRESSION:  Cardiac enlargement.  No definite active pulmonary disease.      Electronically Signed    By: Lucienne Capers M.D.    On: 12/14/2013 21:12         Verified By: Neale Burly, M.D.,  LabUnknown:  PACS Image    Pertinent Past History:  Pertinent Past History 1. Coronary artery status post coronary artery bypass grafting on stent placement.  2. Hypertension.  3. Obesity.  4. Obstructive sleep apnea.  5. Gout.  6. Gastroesophageal reflex disease.  7. Diabetes mellitus.  8. Hyperlipidemia.  9. Chronic kidney disease stage III.   Hospital Course:  Hospital Course a 64 year old male with history of coronary artery disease/coronary artery bypass graft, hypertension, chronic kidney disease, who presents with non-ST-segment elevation myocardial infarction.  * Non-ST-segment elevation myocardial infarction. as per cardiology- subendocardial MI during last admission a week ago, and suggested medical management.  continued on aspirin, statin, and Imdur along with metoprolol.     Cont asa, was on heparin drip.   seen by cardiologist- sugggested to get Cardiac cath on 12/17/13- done- RCA lesion.   suggested to contiue xarelto, asa, and add plavix, increase dose of imdure from 30 to 60 mg  daily.    * CRF- as going for cardiac cath - Started on mucomyst and bicarb drip for renal protection, and nephrology consult. remained stable after cath. * A flutter- on Xarelto and rate control as out pt- continue same. stopped xarelto due to heparin drip. restart now. *  Hypertension. continue his outpatient medications including metoprolol and Imdur.  *  Obesity. Encourage weight loss as tolerated. The patient does say that he has lost some weight.  *  Hyperlipidemia. Continue Zocor.    * Diabetes. We will continue his outpatient medications and sliding scale insulin. discharge home today- follow with Dr. Clayborn Bigness in office next week.   Condition on Discharge Stable   DISCHARGE INSTRUCTIONS HOME MEDS:  Medication Reconciliation: Patient's Home Medications at Discharge:     Medication Instructions  magnesium oxide 400 mg oral tablet  1 tab(s) orally once a day   enalapril 10 mg oral tablet  1 tab(s) orally 2 times a day   gabapentin 300 mg oral capsule  1 cap(s) orally 3 times a day   apidra 100 units/ml subcutaneous solution  20 unit(s) subcutaneous 3 times a day   levemir 100 units/ml subcutaneous solution  45 unit(s) subcutaneous once a day (at bedtime)   aspirin 81 mg oral tablet  1 tab(s) orally once a day   simvastatin 40 mg oral tablet  1 tab(s) orally once a day (at bedtime)   metoprolol tartrate 100 mg oral tablet  1 tab(s) orally 2 times a day   omeprazole 20 mg oral delayed release capsule  1 cap(s) orally once a day   torsemide 100 mg oral tablet  1 tab(s) orally once a day   cyclobenzaprine 10 mg oral tablet  1 tab(s) orally every 8 hours, As Needed, muscle spasm , As needed, muscle spasm   xarelto 20 mg oral tablet  1 tab(s) orally once a day (in the morning)   isosorbide mononitrate 30 mg oral tablet, extended release  2 tab(s) orally once a day  plavix 75 mg oral tablet  1 tab(s) orally once a day    STOP TAKING THE FOLLOWING MEDICATION(S):    naproxen 500 mg  oral tablet: 1 tab(s) orally 2 times a day  Physician's Instructions:  Home Oxygen? Yes   Oxygen delivery at home: 2L  Nasal Cannula   Diet Low Sodium  Low Fat, Low Cholesterol  Carbohydrate Controlled (ADA) Diet   Activity Limitations As tolerated   Return to Work Not Applicable   Time frame for Follow Up Appointment 1-2 weeks  Dr. Clayborn Bigness.   Other Comments Please make appointment next week with Dr. Clayborn Bigness.   Electronic Signatures: Vaughan Basta (MD)  (Signed 11-Sep-15 15:07)  Authored: ADMISSION DATE AND DIAGNOSIS, CHIEF COMPLAINT/HPI, Allergies, PERTINENT LABS, PERTINENT RADIOLOGY STUDIES, PERTINENT PAST HISTORY, HOSPITAL COURSE, DISCHARGE INSTRUCTIONS HOME MEDS, PATIENT INSTRUCTIONS   Last Updated: 11-Sep-15 15:07 by Vaughan Basta (MD)

## 2014-08-02 NOTE — H&P (Signed)
PATIENT NAME:  Parker Robbins, [name] MR#:  [id number] DATE OF BIRTH:  Dec 04, 1950  DATE OF ADMISSION:  12/04/2013  PRIMARY CARE PHYSICIAN: Dr. Maryellen PileEason.   PRIMARY CARDIOLOGIST: Dr. Juliann Paresallwood.  CHIEF COMPLAINT: Chest pain.   HISTORY OF PRESENT ILLNESS: A very pleasant 64 year old male with a history of coronary artery disease, status post CABG and stents, hypertension, who presented with the above complaint. The patient says that he has been in his usual state of health without any problems or chest pain; however, today as he was leaving the store he developed substernal chest pain radiating to his left arm all the way to his fingers, it radiated at a 6/10. Has lasted approximately 1 hour, relieved with nitroglycerin here in the ER. His initial troponin was negative, however, a repeat troponin is slightly elevated at 0.07. The patient is chest pain free currently. He did say that with his chest pain he also had shortness of breath and diaphoresis.   REVIEW OF SYSTEMS: CONSTITUTIONAL: No fever, fatigue, weakness.  EYES: No blurred or double vision or glaucoma.  ENT:  No ear pain, hearing loss, hearing loss, RESPIRATORY: No cough, wheezing, hemoptysis, dyspnea, or COPD.  CARDIOVASCULAR: Positive chest pain, orthopnea, edema, arrhythmia, dyspnea on exertion, palpitations, syncope. GASTROINTESTINAL: No nausea, vomiting, diarrhea, abdominal pain, melena, or ulcers, GENITOURINARY:  No dysuria or hematuria.  ENDOCRINE: No polyuria or polydipsia.  HEMATOLOGIC/LYMPHATIC: No bleeding.  SKIN: No rash or lesions.  MUSCULOSKELETAL: No limited activity. NEUROLOGIC: No history of CVA, TIA, or seizures.  PSYCHIATRIC: No history of anxiety or depression.  PAST MEDICAL HISTORY:  1.  CAD, status post 3-vessel CABG with stents.  2.  Hypertension. 3.  Obesity.  4.  Former history of OSA but apparently this resolved after a uvuloplasty.  5.  Gout.  6.  GERD.  7.  Diabetes.  8.  Hyperlipidemia. 9.  Chronic kidney  disease stage III.   MEDICATIONS:  1.  Apidra 20 units t.i.d.   2.  Aspirin 81 mg daily.  3.  Enalapril 10 mg t.i.d.  4.  Lasix 20 mg b.i.d.  5.  Gabapentin 300 mg t.i.d.  6.  Imdur 30 mg daily.  7.  Levemir 45 units at bedtime.  8.  Magnesium oxide 400 mg daily.  9.  Metoprolol 100 mg b.i.d.  10. Naproxen 500 mg daily.  11. Omeprazole 20 mg daily.  12. Simvastatin 40 mg at bedtime.  13. Demadex 100 mg daily   ALLERGIES: PENICILLIN CAUSES A RASH.   PAST SURGICAL HISTORY:  1.  Uvuloplasty.  2.  Knee arthroplasty.  3.  L4-L5 fusion.   SOCIAL HISTORY:  No tobacco, alcohol, or drug use.   FAMILY HISTORY: Positive for CAD.   PHYSICAL EXAMINATION:  Temperature 97.9, pulse 84, respirations 18, blood pressure 117/56, 97% on room air.  GENERAL: The patient is alert, oriented, no acute distress.  HEENT:  Head is atraumatic. Pupils are round and reactive. Sclerae anicteric. Mucous membranes are moist, oropharynx is clear.  NECK: Supple without JVD, carotid bruit, or enlarged thyroid.  CARDIOVASCULAR: Regular rate and rhythm. No murmurs, gallops, rubs.  PMI is hard to palpate due to body habitus.  LUNGS: Clear to auscultation without crackles, rales, rhonchi or wheezing.  BACK:  No CVA or vertebral tenderness. ABDOMEN: Obese, soft, bowel sounds are positive. Hard to appreciate organomegaly due to body habitus. No rebound, guarding.  EXTREMITIES: No clubbing, cyanosis, edema.  NEUROLOGICAL: Cranial nerves II-XII are intact. There are no focal deficits.  SKIN: Without  rash or lesions.   LABORATORY DATA: First troponin is less than 0.02, repeat is 0.07. CK 269, CPK-MB 2.9. Sodium 141, potassium 4.1, chloride 107, bicarbonate 24, BUN 49, creatinine 2.55, glucose is 198, ALT 18, alkaline phosphatase 65, bilirubin 0.5, AST 24, total protein 6.9, albumin 3.3. White blood cells 4.5, hemoglobin 12, hematocrit 37, platelets are 233,000.   EKG is normal sinus rhythm. There are no ST elevations or  depressions.   ASSESSMENT AND PLAN: This is a 64 year old male with history of coronary artery disease/coronary artery bypass graft, hypertension, chronic kidney disease, who presents with non-ST-segment elevation myocardial infarction.  1.  Non-ST-segment elevation myocardial infarction. The patient's initial troponin was negative. however, repeat troponin is elevated at 0.07. He has substernal chest pain radiating to his left arm associated with shortness of breath and diaphoresis consistent with unstable angina with elevated troponin making it non-ST-segment elevation myocardial infarction. The patient will be admitted on a heparin drip and he will be continued on aspirin, statin, and Imdur along with metoprolol.  2.  Cardiology has been consulted for further evaluation; consider a Myoview scan. Further imaging per Dr. Juliann Pares.  3.  Hypertension. We will continue his outpatient medications including metoprolol and Imdur.  4.  Obesity. Encourage weight loss as tolerated. The patient does say that he has lost some weight.  5.  Hyperlipidemia. Continue Zocor.  We will check fasting lipids.  6.  Diabetes. We will continue his outpatient medications and sliding scale insulin.   The patient is a full code status.    TIME SPENT: Approximately 45 minutes.   ____________________________ Janyth Contes. Juliene Pina, MD spm:lt D: 12/04/2013 17:08:33 ET T: 12/04/2013 17:42:51 ET JOB#: [id number]  cc: Parker Robbins P. Juliene Pina, MD, <Dictator> Serita Sheller. Maryellen Pile, MD Dwayne D. Juliann Pares, MD Janyth Contes [facility] MD ELECTRONICALLY SIGNED 12/04/2013 19:48

## 2014-08-02 NOTE — H&P (Signed)
PATIENT NAME:  Parker Robbins, Parker Robbins MR#:  [id number] DATE OF BIRTH:  09/11/1950  DATE OF ADMISSION:  01/24/2014  ADMITTING PHYSICIAN: Dr. Enid Baas.   PRIMARY CARE PHYSICIAN:  Dr. Maryellen Pile.   CHIEF COMPLAINT: Dark stools.   HISTORY OF PRESENT ILLNESS: [name] is a 64 year old African-American male with past medical history significant for multiple medical problems including coronary artery disease status post bypass graft surgery and stent, status post recent admission in September 2015 for NSTEMI, at which time he had a cardiac catheterization revealing multiple acute blockages and [facility]ology has recommended medical management at the time. The patient also has a history of chronic angina, chronic kidney disease with baseline creatinine around 2.7, sleep apnea, hypertension, diabetes, atrial fibrillation on Xarelto, presents to the hospital secondary to melena for 2 days now. The patient's baseline hemoglobin seems to be around 11, his last discharge his hemoglobin was 10 and now today the hemoglobin is 8.  The patient is slightly tachycardic. Blood pressure is okay. He states he has not had any abdominal pain, nausea, or vomiting, but noticed very dark stools for the last 2 days. He has not had previous history of GI bleed, endoscopies or colonoscopies. The patient is on aspirin, Plavix, and Xarelto and also taking naproxen pain for his chronic pain. So he is being admitted for the same, however his troponin came to be elevated at 1.2 and CK-MB elevated at 8. His antiplatelet agents and Xarelto are being held and Protonix drip has been started at this time.   PAST MEDICAL HISTORY:  1. Coronary artery disease status post bypass graft surgery and also stents put in.  2. Recent admission for NSTEMI status post cardiac catheterization revealing blockages, medical management recommended.  3. Atrial fibrillation on Xarelto.  4. Hypertension.  5. Obesity.  6. Sleep apnea not on CPAP.  7. Gout.   8. Gastroesophageal reflux disease.   9. Chronic angina.  10. Diabetes mellitus, insulin-dependent.  11. Hyperlipidemia.  12. CKD stage 3-4.   PAST SURGICAL HISTORY:  1.  Coronary artery bypass graft surgery.  2.  Uvuloplasty.    3.  Right knee surgery.  4.  Lower back surgery.   ALLERGIES TO MEDICATIONS: PENICILLIN.    CURRENT HOME MEDICATIONS:  1. Apidra insulin 20 units subcutaneous 3 times a day before meals.  2. Aspirin 81 mg p.o. daily.  3. Flexeril 10 mg p.o. q. 8 hours p.r.n. for muscle spasm.  4. Enalapril 10 mg p.o. b.i.d.  5. Gabapentin 300 mg p.o. 3 times a day.  6. Imdur 30 mg p.o. daily.  7. Levemir 45 units subcutaneous once a day at bedtime.  8. Magnesium oxide 400 mg daily.  9. Metoprolol 100 mg p.o. b.i.d.  10. Naproxen 500 mg p.o. b.i.d.   11. Omeprazole 20 mg p.o. daily.  12. Simvastatin 40 mg p.o. at bedtime.   13. Torsemide 100 mg p.o. daily.   14. Xarelto 20 mg p.o. daily.    SOCIAL HISTORY: Lives at home with his wife. He uses walker at baseline.  Quit smoking and alcohol use more than 15 years ago.    FAMILY HISTORY:  Significant for heart disease in the family.   REVIEW OF SYSTEMS:   CONSTITUTIONAL: Positive for fatigue, weakness. No fevers.  EYES: Positive for blurry vision. No double vision, inflammation, or glaucoma.  ENT: No tinnitus, ear pain, hearing loss, epistaxis, or discharge.  RESPIRATORY: Positive for dyspnea at baseline. No cough, wheeze, hemoptysis. CARDIOVASCULAR: Positive for chest  pain which is chronic. No orthopnea, edema, arrhythmia, palpitations, or syncope.  GASTROINTESTINAL: Positive for nausea. No vomiting, diarrhea, abdominal pain, hematemesis. Positive for melena. No active rectal bleed.  GENITOURINARY: No dysuria, hematuria, renal calculus, frequency, or incontinence.  ENDOCRINE: No polyuria, nocturia, thyroid problems, heat or cold intolerance.  HEMATOLOGY: Positive for anemia. No easy bruising or bleeding.  SKIN: No  acne, rash, or lesions.  MUSCULOSKELETAL: Positive for low back pain. No arthritis or gout.   NEUROLOGIC: No numbness, weakness, CVA, TIA, or seizures.  PSYCHOLOGICAL: No anxiety, insomnia, depression.   PHYSICAL EXAMINATION:  VITAL SIGNS:  Temperature 97.5 degrees Fahrenheit, pulse 110, respirations 20, blood pressure 128/104, pulse oximetry 97% on room air.  GENERAL: Heavily built, well-nourished male lying in bed in mild acute distress.  HEENT: Normocephalic, atraumatic. Pupils equal, round, reacting to light. Anicteric sclerae. Extraocular movements intact. Increased tearing noted though patient is not crying. Oropharynx is clear without erythema, mass, or exudates.  NECK: Supple. No thyromegaly, JVD, or carotid bruits.  No lymphadenopathy.  LUNGS: Moving air bilaterally. Decreased bibasilar breath. No wheeze or crackles heard.  CARDIOVASCULAR: S1, S2, irregular rhythm and normal rate. No murmurs, rubs, or gallops heard.  ABDOMEN: Obese, soft. Mild, discomfort in the right lower quadrant, but no guarding, tenderness, or rigidity noted. Normal bowel sounds are present.   EXTREMITIES:   1 + pedal edema. No clubbing or cyanosis. Feeble dorsalis pedis pulses palpable bilaterally.  SKIN: No acne, rash, or lesions.  LYMPHATICS: No cervical lymphadenopathy.  NEUROLOGIC: Cranial nerves intact. No focal motor or sensory deficits.  Occasional myoclonic jerks noted which are chronic for patient.   PSYCHIATRIC:  The patient is awake, alert, oriented x 3.   LABORATORY DATA:  1.  WBC 7.9, hemoglobin 8.3, hematocrit 26.0, platelet count 315,000.  2.  Sodium 136, potassium 4.9, chloride 100, bicarbonate 24, BUN 19, creatinine 3.09, glucose 164, calcium of 8.8.  3.  ALT 12, AST 22, alkaline phosphatase 58, total bilirubin 0.2, albumin of 3.8.  4.  Troponin I 1.3, CK 182, CK-MB 8.0  INR is 1.9, PTT 47.6.  5.  EKG showing atrial fibrillation with RVR, heart rate of 111.   ASSESSMENT AND PLAN: A  64 year old male with history of multiple medical problems including hypertension, diabetes, atrial fibrillation on Xarelto, coronary artery disease status post coronary artery bypass graft, and chronic angina, admitted last month for non-ST elevation myocardial infarction, cardiac catheterization revealing multiple acute  blockages and medical management recommended. The patient on aspirin and Plavix, per him comes with melena for 2 days, drop in hemoglobin from 10 to 8 today.   1.  Melena, likely upper gastrointestinal bleed. Hold aspirin, Plavix, Xarelto, and also nonsteroidal anti-inflammatory drugs at this time. Discussed with GI and also cardiology.  We will start Protonix drip for now.  Per cardiology aspirin should be restarted whenever okay by GI, especially in light of recent NSTEMI.   2.  Acute on chronic anemia secondary to gastrointestinal bleed. Transfuse 1 unit, hemoglobin check  q. 6 hours and monitor.  3.  Elevated troponin, demand ischemia recent non-ST elevation myocardial infarction, again now presenting with gastrointestinal bleed. Hold all anticoagulants. Cardiology consult.  Recycle troponins.  Monitor on telemetry.  4.  Hypertension. Continue metoprolol.  5.  Acute renal failure on chronic kidney disease, baseline creatinine of 2.3, now with creatinine of 3.09, start gentle hydration, hold torsemide, packed RBC transfusion, and nephrology consulted. Also recent cardiac catheterization done last month so could be contrast nephropathy.  Monitor for now.  5.  Diabetes mellitus.  Since only on clear liquids we will decrease the Lantus dose and monitor.  6.  Deep vein thrombosis prophylaxis. With TEDs and SCDs.   CODE STATUS: Full code.   TIME SPENT ON ADMISSION: 55 minutes.    ____________________________ Enid Baas, MD rk:bu D: 01/24/2014 18:58:00 ET T: 01/24/2014 20:23:53 ET JOB#: [id number]  cc: Serita Sheller. Maryellen Pile, MD Enid Baas, MD, <Dictator>  Enid Baas  MD ELECTRONICALLY SIGNED 2014/02/11 14:03

## 2014-08-02 NOTE — Consult Note (Signed)
Pt seen and examined. Full consult to follow.  Pt with CAD, sleep apnea, S/P MI last month, who presents with melena and drop in hgb. Some epigastric pain. Hx of GERD. No prior PUD hx. On plavix and xarelto since MI. These meds have been held. Also on bASA. Protonix IV started. Would like cardiac clearance before scheduling EGD, perhaps Monday. Pt also need to be off pressors prior to EGD., Pt is relatively high risk for complications for sedation with his cardiac hx/sleep apnea. Will follow. Thanks    Electronic Signatures: Lutricia Feilh, Marina Desire (MD) (Signed on 17-Oct-15 10:10)  Authored   Last Updated: 17-Oct-15 10:13 by Lutricia Feilh, Eban Weick (MD)

## 2014-08-02 NOTE — Discharge Summary (Signed)
PATIENT NAME:  Parker Robbins, Parker Robbins MR#:  [id number] DATE OF BIRTH:  03/09/1951  DATE OF ADMISSION:  01/24/2014 DATE OF DISCHARGE:  01/30/2014  For a detailed note, please see the history and physical done on admission by Dr. Nemiah Commander. Please also look at the detailed interim discharge summary done by Dr. Alford Highland, which covers the extensive part of the hospital course: This is a short addendum to that.   DIAGNOSES:  1.  Acute on chronic diastolic congestive heart failure.  2.  Gastrointestinal bleed.  3.  Acute on chronic renal failure.  4.  Non-ST-elevation myocardial infarction.  5.  Diabetes.  6.  Obstructive sleep apnea.  7.  Hyperlipidemia.   DIET: The patient is being discharged on a low-sodium, low-fat, American Diabetic Association diet.   ACTIVITY: As tolerated.   FOLLOWUP: With Dr. Maryellen Pile in the next 1-2 weeks. Also follow up with Dr. Thedore Mins in the next 1-2 weeks.   The patient is being discharged home with hospice.   DISCHARGE MEDICATIONS: Magnesium oxide 400 mg daily, gabapentin 300 mg t.i.d., Apidra insulin 20 units t.i.d. with meals, Levemir 45 units at bedtime, aspirin 81 mg daily, simvastatin 40 mg at bedtime, metoprolol tartrate 100 mg b.i.d., Flexeril 10 mg q.8 h. as needed, Imdur 30 mg daily, Naprosyn 5 mg b.i.d., Ranexa 500 mg b.i.d., enalapril 10 mg daily, Demadex 20 mg 2 tabs daily, and Protonix 40 mg b.i.d.   CONSULTANTS DURING THE HOSPITAL COURSE: Dr. Harriett Sine Phifer, palliative care; Dr. Mosetta Pigeon, nephrology; Dr. Lutricia Feil, gastroenterology; Dr. Marcina Millard, cardiology.   HOSPITAL COURSE: This is a 64 year old male who presented to the hospital initially on 01/24/2014 due to melenic stools and GI bleed.   1.  Hypovolemic shock. This was secondary to the acute GI bleed. This has now resolved as the patient's hemoglobin is stable. He has had no further bleeding. He is clinically hemodynamically stable.  2.   Acute blood loss anemia. This was secondary to  an upper GI bleed. The patient was transfused a total of 4 units of packed red blood cells in the hospital. Hemoglobin has remained stable since then. He is now tolerating a regular diet. His endoscopy showed no evidence of acute bleeding. As per GI, he probably cannot tolerate colonoscopy. For now, he will be discharged on a PPI b.i.d. with close follow-up with GI as an outpatient.  3.  Non-ST elevation MI. The patient's troponins did trend up as high as 1.4. He remained hemodynamically stable and chest pain-free. He could not be anticoagulated given his gastrointestinal bleed. Since he is clinically stable, he will be discharged on medical management with aspirin, beta blocker, statin, and ACE inhibitor, as stated.  4.  Chronic atrial fibrillation. The patient's rates have remained controlled. He will continue his metoprolol. At this point I am taking him off the Xarelto as he had a recent GI bleed. He will continue baby aspirin.  5.  Obstructive sleep apnea. The patient was maintained on CPAP. He will resume that.  6.  Hyperlipidemia. The patient was maintained on his Crestor. He will resume that.  7.  Diabetes. The patient was maintained on his Levemir and sliding scale insulin. He will resume that.  8.  Acute on chronic renal failure. This was likely acute tubular necrosis from his hypovolemic shock. This improved with IV fluids and also blood transfusion. I discussed with nephrology. He is okay to be discharged, and they will see him as an outpatient and follow-up his creatinine  next week or so.  9.  Constipation. This has since improved and resolved with some lactulose.   The patient is a DO NOT INTUBATE/DO NOT RESUSCITATE.   DISPOSITION: He is being discharged home with hospice services.   TIME SPENT ON DISCHARGE: 40 minutes    ____________________________ Rolly PancakeVivek J. Cherlynn KaiserSainani, MD vjs:je D: 10/ 22/2015 16:37:00 ET T: 01/31/2014 09:23:30 ET JOB#: [id number]  cc: Serita ShellerErnest B. Maryellen PileEason, MD Rolly PancakeVivek J.  Cherlynn KaiserSainani, MD, <Dictator> Mosetta PigeonHarmeet Singh, MD Houston SirenVIVEK J Gene Colee MD ELECTRONICALLY SIGNED 02/17/2014 11:03

## 2014-08-02 NOTE — Consult Note (Signed)
PATIENT NAME:  Parker Robbins, [name] MR#:  [id number] DATE OF BIRTH:  [birth year]  DATE OF CONSULTATION:  12/05/2013  REFERRING PHYSICIAN:  Shea EvansVaishali Mody, MD  CONSULTING PHYSICIAN:  Lamar BlinksBruce J. Gianny Killman, MD  REASON FOR CONSULTATION: Acute elevated troponin with coronary artery disease, chronic kidney disease, hypertension, hyperlipidemia, sleep apnea, diabetes with atrial flutter.  CHIEF COMPLAINT:  Chest pain.    HISTORY OF PRESENT ILLNESS:  This is a 64 year old male with known coronary artery disease status post multiple stents and coronary artery bypass graft several years ago.  He has had known chronic stable angina for which he is mildly short of breath with physical activity on isosorbide but not having intermittent episodes of chest pain.  He has had an acute onset of shortness of breath and chest discomfort while leaving a restaurant and had shortness of breath throughout that time and had to sit down and take some nitroglycerin and aspirin.  This slowly relieved his symptoms and he was completely relieved when coming to the hospital.  At that time, he had an EKG showing atrial flutter with controlled ventricular rate and nonspecific ST changes.  In addition to that, the patient has had elevated troponin to a peak of 0.13 consistent with minimal subendocardial myocardial infarction.  He does not have any evidence of significant congestive heart failure or history of LV dysfunction or myocardial infarction at this time.  He does have significant chronic kidney disease with a creatinine of 2.7.  Hypertension is relatively well-controlled.  Sleep apnea is apparently previously treated with uvuloplasty, so it could be evaluated and concerning at this time.  He does have some obesity.   REVIEW OF SYSTEMS: The remainder review of systems negative for vision change, ringing in the ears, hearing loss, cough, congestion, heartburn, nausea, vomiting, diarrhea, bloody stools, stomach pain, extremity pain, leg  weakness, cramping of the buttocks, no blood clots, headaches, blackouts, dizzy spells, nosebleeds, congestion, trouble swallowing, frequent urination, urination at night, muscle weakness, numbness, anxiety, depression, skin lesions or skin rashes.   PAST MEDICAL HISTORY:  1.  Known coronary artery disease, status post coronary artery bypass graft and stents.  2.  Chronic kidney disease.  3.  Hypertension.  4.  Sleep apnea.  5.  Diabetes.  6.  Atrial flutter.   FAMILY HISTORY: There are multiple family history of cardiovascular disease with multiple diabetes, as well.   SOCIAL HISTORY: Currently denies alcohol or tobacco use.   ALLERGIES: As listed.   MEDICATIONS: As listed.   PHYSICAL EXAMINATION:  VITAL SIGNS: Blood pressure is 144/68 bilaterally. Heart rate is 68 upright, reclining, and regular.  GENERAL: He is a well-appearing male in no acute distress.  HEENT: No icterus, thyromegaly, ulcers, hemorrhage, or xanthelasma.  CARDIOVASCULAR: Regular rate and rhythm. Distant heart sounds. No apparent murmur, gallop, or rub. PMI is diffuse. Carotid upstroke normal without bruit. Jugular venous pressure is normal.  LUNGS: Fairly clear to auscultation with normal respirations.  ABDOMEN: Soft, nontender. Cannot assess hepatosplenomegaly or masses, due to increased abdominal girth.  EXTREMITIES: 2+ radial, trace femoral and dorsal pedal pulses, with trace lower extremity edema. No cyanosis, clubbing or ulcers.  NEUROLOGIC: He is oriented to time, place, and person, with normal mood and affect.   ASSESSMENT: This is a 64 year old male with: 1.  Known coronary artery disease, status post coronary artery bypass graft stenting. 2.  Chronic kidney disease. 3.  Hypertension.  4.  Hyperlipidemia. 5.  Sleep apnea. 6.  Diabetes.  7.  Atrial flutter  with acute chest discomfort and elevated troponin consistent with minimal subendocardial myocardial infarction.   RECOMMENDATIONS:  1.  Continue  serial ECG and enzymes to assess for extent of myocardial infarction.  2.  Echocardiogram for LV systolic dysfunction and changes.  3.  Continue heparin for 24 more hours and consider Plavix and aspirin for further risk reduction of cardiovascular event. 4.  Would defer cardiac catheterization due to significant chronic kidney disease unless the patient had recurring chest discomfort consistent with more concerning coronary artery disease instability.  5.  Continue to evaluate for the possibility of sleep apnea contributing to above.  6.  Diabetes with a goal hemoglobin A1c below 7.   7.  Hypertension controlled with beta blocker and also isosorbide for treatment of anginal symptoms and coronary artery disease.  8.  Further follow atrial flutter with heart rate control and use beta blocker as necessary, and would consider anticoagulation if able to reduce cardiovascular risk due to a CHADS score of 4.     ____________________________ Lamar Blinks, MD bjk:DT D: 12/05/2013 08:14:06 ET T: 12/05/2013 08:30:37 ET JOB#: [id number]  cc: Lamar Blinks, MD, <Dictator> Lamar Blinks MD ELECTRONICALLY SIGNED 12/05/2013 13:10

## 2014-08-02 NOTE — Consult Note (Signed)
Brief Consult Note: Diagnosis: NQMI?/Elevated troponins/CRI.   Patient was seen by consultant.   Consult note dictated.   Recommend further assessment or treatment.   Orders entered.   Discussed with Attending MD.   Comments: IMP Elevated troponin NQMI CAD [place] HTN DM Obesity CRI Hyperlipidemia Paroxysmal AFIB . PLAN Tele Nephhrolgy input Consider cardiac cath prior to d/c Statins therapy Continue Bp control Continue DM management DVT prophylaxis Anticoug short term for ACS Hold xeralto for possible cath.  Electronic Signatures: Dorothyann Pengallwood, Dwayne D (MD)  (Signed 07-Sep-15 08:10)  Authored: Brief Consult Note   Last Updated: 07-Sep-15 08:10 by Alwyn Peaallwood, Dwayne D (MD)

## 2014-08-02 NOTE — Consult Note (Signed)
PATIENT NAME:  Parker Robbins, PROVENCE MR#:  [id number] DATE OF BIRTH:  Jul 18, 1950  CARDIAC CONSULTATION   DATE OF ADMISSION: 12/14/2013  DATE OF CONSULTATION:  12/15/2013  CONSULTING PHYSICIAN:  Jalon Blackwelder D. Juliann Pares, MD  PRIMARY PHYSICIAN: Serita Sheller. Maryellen Pile, MD  INDICATION: Unstable angina, possible non-Q-wave myocardial infarction.   HISTORY OF PRESENT ILLNESS: The patient is a 64 year old morbidly obese black male with a history of coronary artery disease, coronary bypass surgery, who had recent admission in August for chest pain, treated medically and discharged home. Had mild elevation in troponin of 0.17. The patient was treated with heparin. The patient had significant elevation in creatinine. Had significant recurrent chest pain symptoms, midsternal, no radiation, significant tightness, so he came to the Emergency Room for evaluation and possible admission. Renal function was higher at 2.8. Chest pain was better after admission.  PAST MEDICAL HISTORY:  1. Coronary artery disease.  2. Hypertension.  3. Obesity.  4. Obstructive sleep apnea. 5. Gout. 6. GERD.  7. Diabetes.  8. Hyperlipidemia.  9. Chronic renal insufficiency.   ALLERGIES: PENICILLIN.   MEDICATIONS: He was on: 1. Xarelto 20 a day.  2. Torsemide 100 a day. 3. Simvastatin 40 a day.  4. Omeprazole 20. 5. Naprosyn 500 two times a day.  6. Metoprolol 100 twice a day.  7. Mag oxide 400 a day.  8. Levemir 45 once a day. 9. Imdur 30 a day.  10. Gabapentin 300 three times a day.  11. Enalapril 10 twice a day. 12. Flexeril 10 every 8 hours p.r.n. 13. Aspirin 81 mg a day. 14. Apidra 20 units subcutaneous 3 times a day.   SOCIAL HISTORY: No smoking or alcohol consumption. Married, lives with his family.   PAST SURGICAL HISTORY:  1. Uvuloplasty.  2. Knee arthroplasty.  3. Back surgery   FAMILY HISTORY: Coronary artery disease.   REVIEW OF SYSTEMS: No blackout spells or syncope. No nausea or vomiting. No fever, no  chills, no sweats. No weight loss, no weight gain. No hemoptysis or hematemesis. No bright red blood per rectum. He has had weakness, fatigue.   PHYSICAL EXAMINATION:  VITAL SIGNS: Blood pressure 134/70, pulse of 85, respiratory rate of 16, afebrile.  HEENT: Normocephalic, atraumatic. Pupils equally reactive supple.  NECK: Supple. No JVD, bruits or adenopathy.  LUNGS: Clear.  HEART: Regular rate and rhythm. Systolic ejection murmur at left sternal border. Positive S4.  ABDOMEN: Benign.  EXTREMITIES: Within normal limits.  NEUROLOGIC: Intact.  SKIN: Normal.   LABORATORY DATA: BUN of 46, creatinine 2.79. CK 351. PT 17, INR 1.4. Chest x-ray negative. CBC negative.   ASSESSMENT:  1. Chest pain, angina.  2. Chronic renal insufficiency.  3. Morbid obesity.  4. Diabetes.  5. Coronary artery disease.  6. Non-Q-wave myocardial infarction.  7. Atrial fibrillation.   PLAN:  1. Recommend rule-out for myocardial infarction. Follow up cardiac enzymes. Continue therapy for chest pain and angina. Short-term anticoagulation.  2. Chronic renal insufficiency. Consult nephrology. Hold off on naproxen .  3. Morbid obesity. Recommend weight loss, exercise and portion control.  4. For diabetes, continue Levemir, Apidra.  5. For coronary artery disease, status post coronary bypass surgery and stent, recommend enalapril, metoprolol, aspirin therapy.  6. Deep vein thrombosis prophylaxis, atrial fibrillation. Will hold Xarelto. Consider cardiac catheterization. We will use renal protection until then.  7. Have the patient follow up based on the results of the cardiac catheterization, will determine further evaluation.   ____________________________ Bobbie Stack Juliann Pares, MD ddc:lb D:  12/17/2013 13:26:00 ET T: 12/17/2013 14:40:06 ET JOB#: [id number]  cc: [place] Kielbasa D. Juliann Paresallwood, MD, <Dictator> Alwyn PeaWAYNE D Jahnia Hewes MD ELECTRONICALLY SIGNED 01/07/2014 14:53

## 2014-08-02 NOTE — Consult Note (Signed)
PATIENT NAME:  Parker Robbins, [name] MR#:  [id number] DATE OF BIRTH:  08-May-1950  DATE OF CONSULTATION:  01/25/2014  REFERRING PHYSICIAN:  Enid Baasadhika Kalisetti, MD CONSULTING PHYSICIAN:  Marcina MillardAlexander Meiling Hendriks, MD  PRIMARY CARE PHYSICIAN: Serita Shellerrnest B. Maryellen PileEason, MD  CARDIOLOGY CONSULTATION   CHIEF COMPLAINT: Dark stools.   HISTORY OF PRESENT ILLNESS: The patient is a [age] year old gentleman with known coronary artery disease, admitted at this time with a GI bleed. The patient has a history of coronary artery bypass graft surgery with previous coronary stents. The patient was admitted on 12/24/2013 for non-STEMI, at which time cardiac catheterization revealed diffuse stenosis. [facility] recommended continued medical therapy.   The patient was on aspirin, clopidogrel, and Xarelto for atrial fibrillation. The patient experienced dark stools and generalized weakness, and presented to [facility] Emergency Room where he was noted to be markedly anemic with a hemoglobin of 8. Admission labs were notable for borderline elevated troponin of 1.2. The patient was started on a Protonix infusion.   PAST MEDICAL HISTORY:  1.  Coronary artery bypass graft surgery.  2.  Coronary stents.  3.  Non-STEMI in September 2015.  4.  Atrial fibrillation.  5.  Hypertension.  6.  Sleep apnea.  7.  Gastroesophageal reflux disease.  8.  Insulin-requiring diabetes.  9.  Chronic kidney disease.   MEDICATIONS: Aspirin 81 mg daily, clopidogrel 75 mg daily, Xarelto 20 mg daily, enalapril 10 mg daily, Imdur 30 mg daily, metoprolol 100 mg b.i.d., simvastatin 40 mg at bedtime, torsemide 100 mg daily, insulin 20 units t.i.d. a.c. meals, Flexeril 10 mg q. 8 hours p.r.n., gabapentin 300 mg t.i.d., Levemir 45 units daily at bedtime, magnesium oxide 400 mg daily, naproxen 5 mg b.i.d., omeprazole 20 mg daily.   SOCIAL HISTORY: The patient currently lives with his wife at home. He quit smoking  approximately 15 years ago.   FAMILY HISTORY: Positive for coronary artery disease.   REVIEW OF SYSTEMS:  CONSTITUTIONAL: No fever or chills.  EYES: No blurry vision.  EARS: No hearing loss.  RESPIRATORY: Shortness of breath.  CARDIOVASCULAR: No chest pain.  GASTROINTESTINAL: Dark stools, as described above.  GENITOURINARY: No dysuria or hematuria.  ENDOCRINE: No polyuria or polydipsia.  MUSCULOSKELETAL: No arthralgias or myalgias.  NEUROLOGICAL: No focal muscle weakness or numbness.  PSYCHOLOGICAL: No depression or anxiety.   PHYSICAL EXAMINATION:  VITAL SIGNS: Blood pressure 123/86, pulse 69, respirations 15, temperature 96.6, pulse oximetry 100%.  HEENT: Pupils equal, reactive to light and accommodation.  NECK: Supple without thyromegaly.  LUNGS: Clear.  HEART: Normal JVP. Normal PMI.  Irregularly irregular rhythm. Normal S1, S2. No appreciable gallop, murmur, or rub.  ABDOMEN: Soft and nontender.  VASCULAR: Pulses were intact bilaterally.  MUSCULOSKELETAL: Normal muscle tone.  NEUROLOGIC: The patient is alert and oriented x 3. Motor and sensory are both grossly intact.   IMPRESSION: A 64 year old gentleman who presents with dark stools, gastrointestinal bleed, and anemia, with a borderline elevated troponin likely due to demand supply ischemia in the absence of chest pain or new electrocardiogram changes. The patient has known coronary artery disease, status post prior bypass graft surgery with multiple stenoses and diffuse disease, not felt to be amenable to revascularization.   RECOMMENDATIONS:  1.  Agree with overall current therapy.  2.  Hold clopidogrel.  3.  Hold Xarelto for now.  4.  Await GI consult.  5.  Defer anticoagulation.  6.  Further recommendations pending GI consult.  ____________________________ Marcina Millard, MD ap:MT D: 01/25/2014 09:13:01 ET T: 01/25/2014 09:40:29 ET JOB#: [id number]  cc: Marcina Millard, MD, <Dictator> Marcina Millard MD ELECTRONICALLY SIGNED 01/25/2014 14:13

## 2015-08-10 IMAGING — CT CT HEAD WITHOUT CONTRAST
2 series · 15 of 30 positions shown, 19 images · non-contrast
Comparison: Brain MRI, 11/06/2009 and head CT, 10/30/2009

CLINICAL DATA: Patient brought from home by EMS for low blood
glucose. Patient found unresponsive to verbal stimuli. Initial blood
glucose of 30. Patient has subsequently been treated. Patient
unresponsive still to verbal stimuli however responsive to pain.

EXAM:
CT HEAD WITHOUT CONTRAST
TECHNIQUE: Contiguous axial images were obtained from the base of the skull
through the vertex without intravenous contrast.

[Series 2: soft tissue · axial · 0.43mm/px · z∈[-76,+54]mm · 14 of 32 slices shown, 18 images]
[im 3/32  brain]
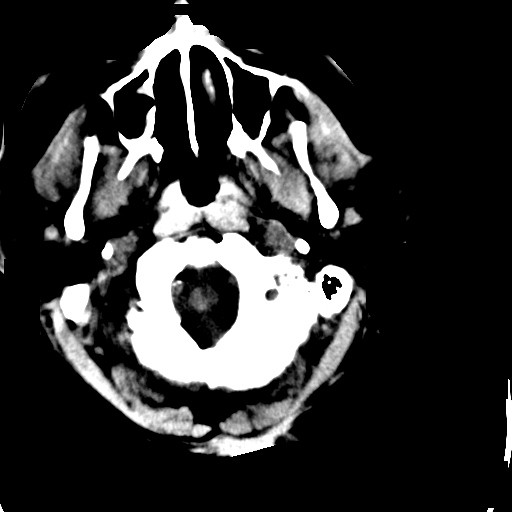
[im 3/32  bone]
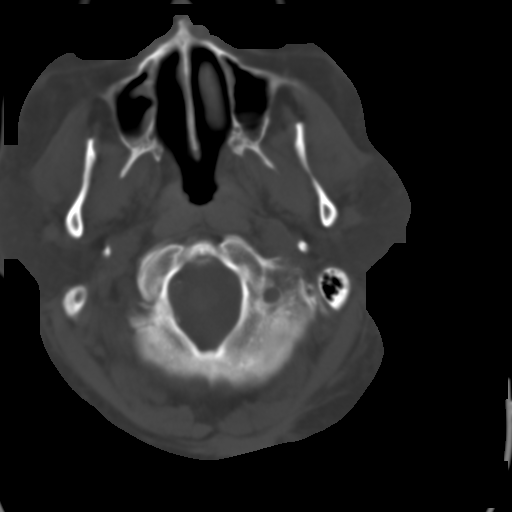
[im 5/32  brain]
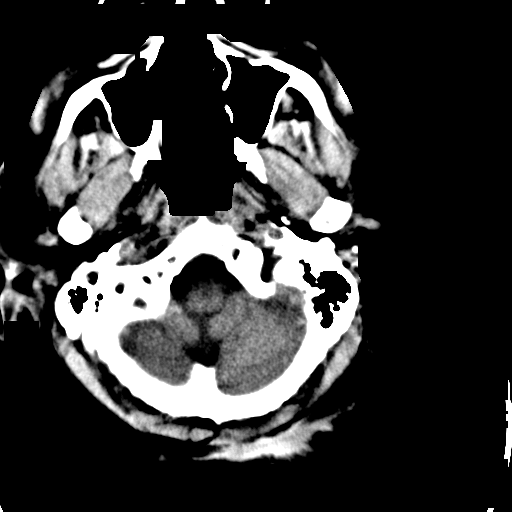
[im 7/32  brain]
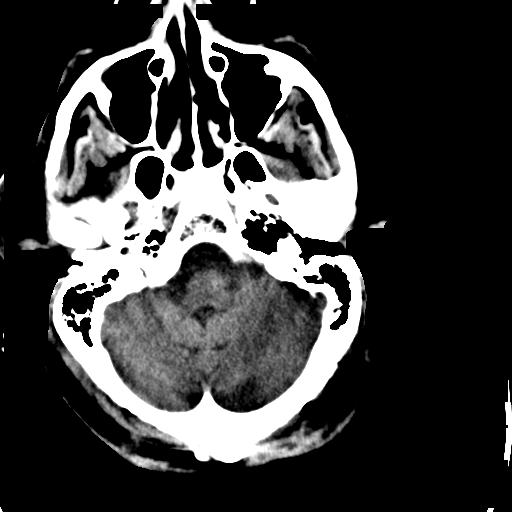
[im 9/32  brain]
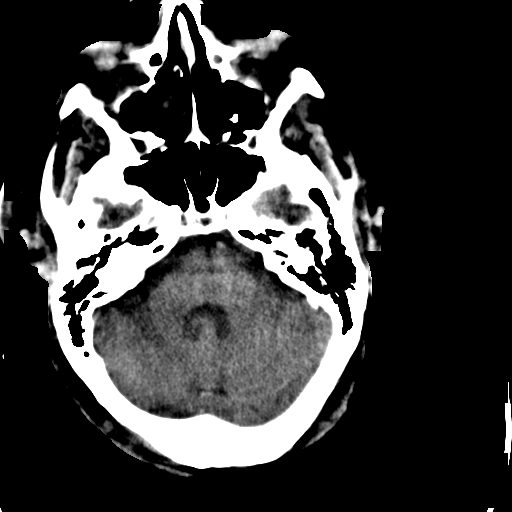
[im 11/32  brain]
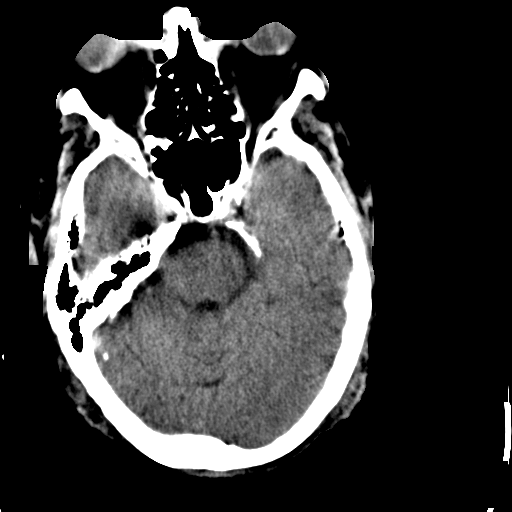
[im 11/32  bone]
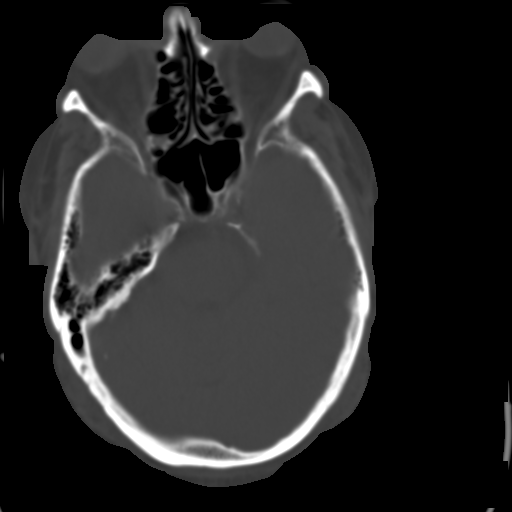
[im 13/32  brain]
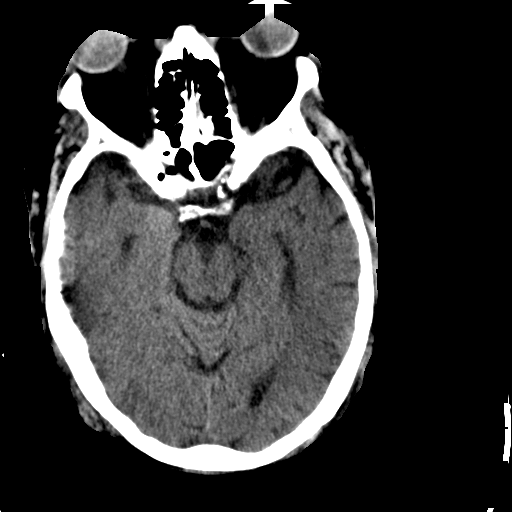
[im 15/32  brain]
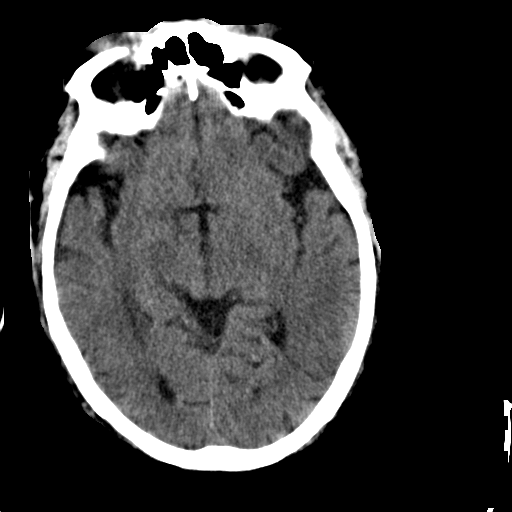
[im 17/32  brain]
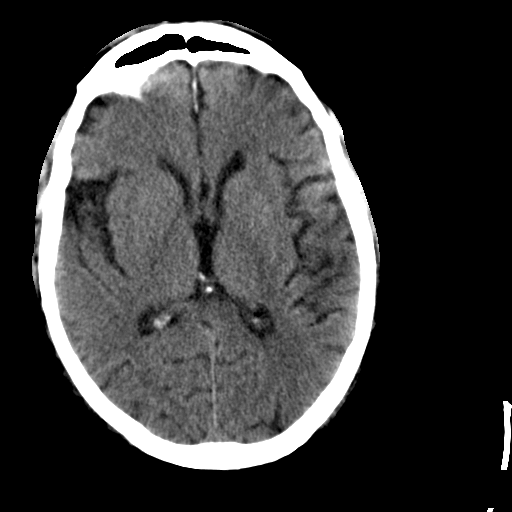
[im 19/32  brain]
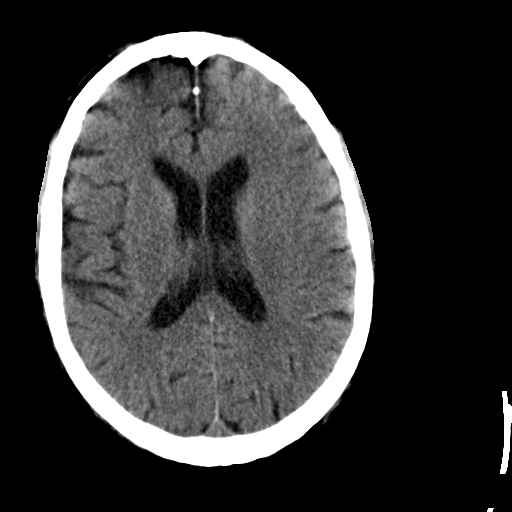
[im 19/32  bone]
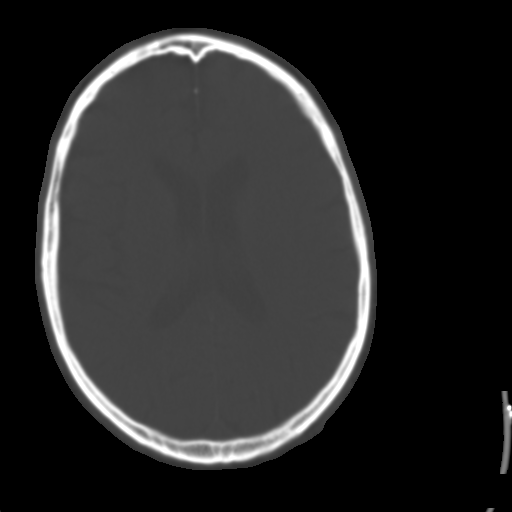
[im 21/32  brain]
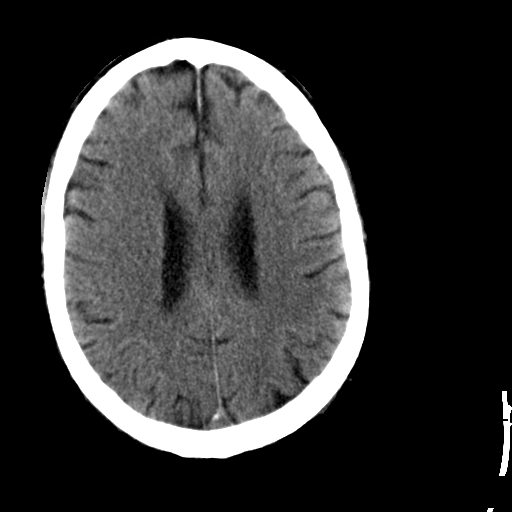
[im 23/32  brain]
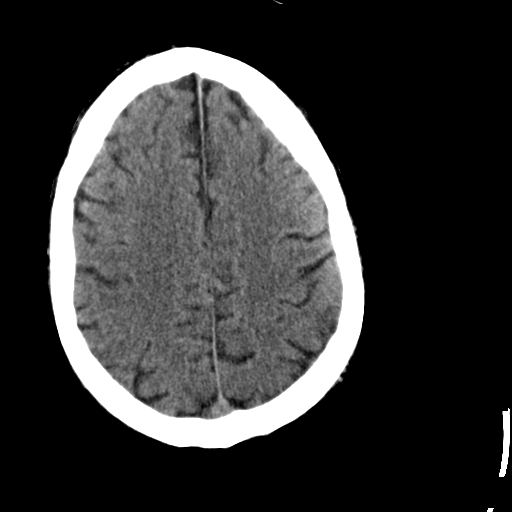
[im 25/32  brain]
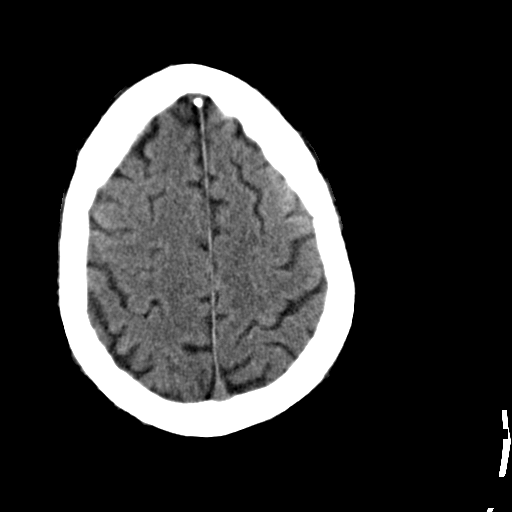
[im 27/32  brain]
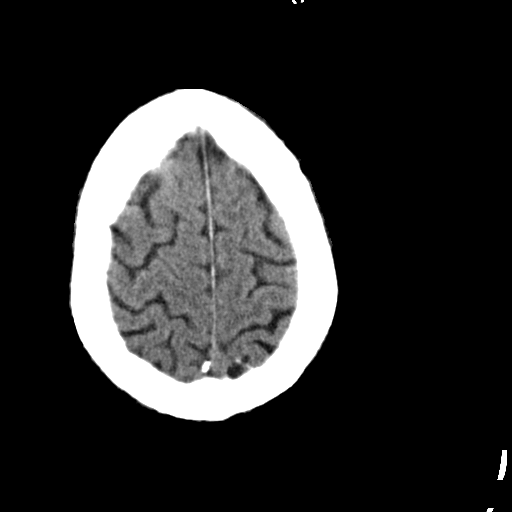
[im 27/32  bone]
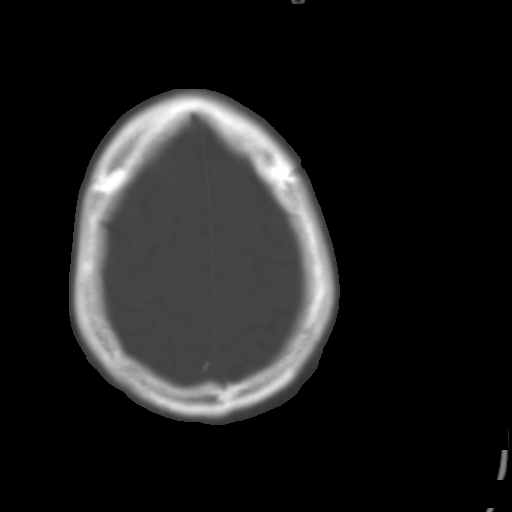
[im 29/32  brain]
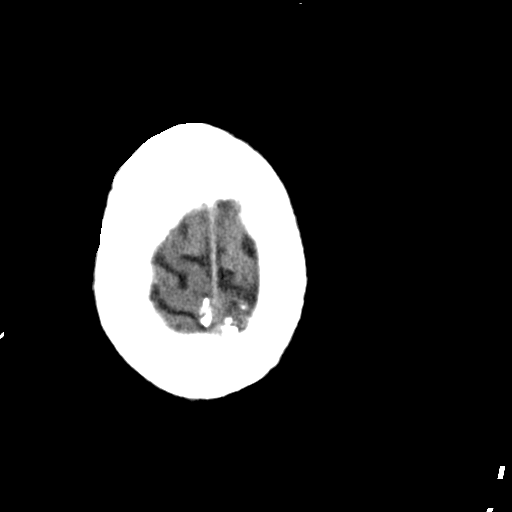

[Series 4: soft tissue recon · axial · 0.42mm/px · 1 of 27 slices shown]
[im 3/27  brain]
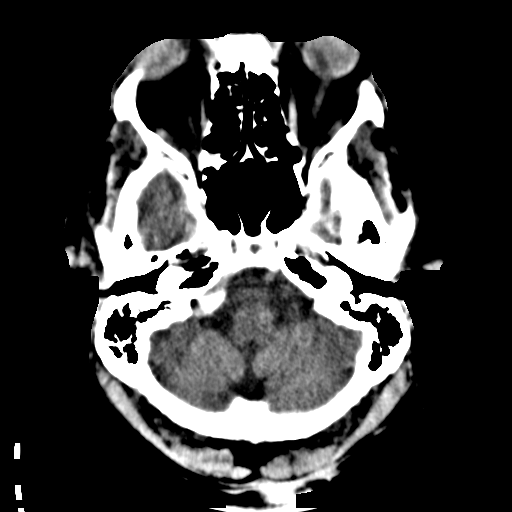

[15 of 30 positions shown; findings below may reference images not displayed]

FINDINGS: Ventricles are normal in configuration. There is ventricular and
sulcal enlargement reflecting mild atrophy. No hydrocephalus.

No parenchymal masses or mass effect. There is no evidence of an
infarct. There are no extra-axial masses or abnormal fluid
collections.

No intracranial hemorrhage.

Visualized sinuses and mastoid air cells are clear.
IMPRESSION: 1. No acute intracranial abnormalities.
2. Mild atrophy.  Similar appearance to the patient's prior head CT.
